# Patient Record
Sex: Female | Born: 1990 | Race: Asian | Hispanic: No | Marital: Married | Smoking: Never smoker
Health system: Southern US, Community
[De-identification: ages and names within clinical notes are randomized; demographics above are authoritative.]

## PROBLEM LIST (undated history)

## (undated) DIAGNOSIS — Z789 Other specified health status: Secondary | ICD-10-CM

## (undated) HISTORY — DX: Other specified health status: Z78.9

## (undated) HISTORY — PX: NO PAST SURGERIES: SHX2092

---

## 2016-11-07 ENCOUNTER — Encounter: Payer: Self-pay | Admitting: Obstetrics and Gynecology

## 2016-11-07 ENCOUNTER — Ambulatory Visit (INDEPENDENT_AMBULATORY_CARE_PROVIDER_SITE_OTHER): Payer: BLUE CROSS/BLUE SHIELD | Admitting: Obstetrics and Gynecology

## 2016-11-07 VITALS — BP 115/66 | HR 78 | Ht 63.0 in | Wt 149.5 lb

## 2016-11-07 DIAGNOSIS — N912 Amenorrhea, unspecified: Secondary | ICD-10-CM

## 2016-11-07 DIAGNOSIS — Z87898 Personal history of other specified conditions: Secondary | ICD-10-CM

## 2016-11-07 DIAGNOSIS — Z3201 Encounter for pregnancy test, result positive: Secondary | ICD-10-CM | POA: Insufficient documentation

## 2016-11-07 LAB — POCT URINE PREGNANCY: Preg Test, Ur: POSITIVE — AB

## 2016-11-07 NOTE — Progress Notes (Signed)
GYN ENCOUNTER NOTE  Subjective:       Hannah Larson is a 25 y.o. [id number] female is here for gynecologic evaluation of the following issues:  1. Pregnancy confirmation  LMP: 09/26/2016 (certain) EDD: 07/03/2017 EGA: 6.0 weeks  No history of vaginal bleeding, pelvic pain, nausea and vomiting. Last pregnancy was complicated by nausea and vomiting of pregnancy requiring Diclegis. Currently taking over-the-counter prenatal vitamins.  G1-SVD at 39 weeks, uncomplicated   Gynecologic History No LMP recorded. Patient is pregnant. Contraception: none  Obstetric History OB History  Gravida Para Term Preterm AB Living  2 1 1     1   SAB TAB Ectopic Multiple Live Births          1    # Outcome Date GA Lbr Len/2nd Weight Sex Delivery Anes PTL Lv  2 Current           1 Term 2016   8 lb 9.6 oz (3.901 kg) M Vag-Spont   LIV      Past Medical History:  Diagnosis Date  . Known health problems: none     Past Surgical History:  Procedure Laterality Date  . NO PAST SURGERIES      No current outpatient prescriptions on file prior to visit.   No current facility-administered medications on file prior to visit.     No Known Allergies  Social History   Social History  . Marital status: Married    Spouse name: N/A  . Number of children: N/A  . Years of education: N/A   Occupational History  . Not on file.   Social History Main Topics  . Smoking status: Never Smoker  . Smokeless tobacco: Never Used  . Alcohol use No  . Drug use: No  . Sexual activity: Yes    Birth control/ protection: None   Other Topics Concern  . Not on file   Social History Narrative  . No narrative on file    Family History  Problem Relation Age of Onset  . Diabetes Sister   . Breast cancer Neg Hx   . Migraines Neg Hx   . Colon cancer Neg Hx   . Heart disease Neg Hx     The following portions of the patient's history were reviewed and updated as appropriate: allergies, current medications,  past family history, past medical history, past social history, past surgical history and problem list.  Review of Systems Review of Systems -per History of present illness  Objective:   BP 115/66   Pulse 78   Ht 5\' 3"  (1.6 m)   Wt 149 lb 8 oz (67.8 kg)   BMI 26.48 kg/m  CONSTITUTIONAL: Well-developed, well-nourished female in no acute distress.  Physical exam-deferred    Assessment:   1. Amenorrhea - POCT urine pregnancy  2. Positive urine pregnancy test  3. History of nausea and vomiting, None current  Overall low risk pregnancy     Plan:   1. UPT-POSITIVE 2.  New OB counseling: The patient has been given an overview regarding routine prenatal care. Recommendations regarding diet, weight gain, and exercise in pregnancy were given. Prenatal testing, optional genetic testing, and ultrasound use in pregnancy were reviewed.  Benefits of Breast Feeding were discussed. The patient is encouraged to consider nursing her baby post partum. 3. Return in 3 weeks for nursing OB visit 4. Return in 5 weeks for new OB history and physical with Dr. Valentino Saxonherry  A total of 30 minutes were spent face-to-face with the patient  during the encounter with greater than 50% dealing with counseling and coordination of care.  Herold HarmsMartin A Josephus Harriger, MD  Note: This dictation was prepared with Dragon dictation along with smaller phrase technology. Any transcriptional errors that result from this process are unintentional.

## 2016-11-07 NOTE — Patient Instructions (Signed)
1. Urine pregnancy test is confirmed 2. Overview of prenatal care, diet and exercise in pregnancy reviewed 3. Return in 3 weeks for new OB nursing visit 4. Return in 5 weeks for new OB history and physical with Dr. Valentino Saxonherry 5. Continue with prenatal vitamins daily 6. Contact the office if nausea and vomiting of pregnancy develops

## 2016-11-09 ENCOUNTER — Telehealth: Payer: Self-pay | Admitting: Obstetrics and Gynecology

## 2016-11-09 MED ORDER — DOXYLAMINE-PYRIDOXINE 10-10 MG PO TBEC: 10.0000 mg | DELAYED_RELEASE_TABLET | Freq: Every day | ORAL | 1 refills | Status: DC

## 2016-11-09 NOTE — Telephone Encounter (Signed)
Pt aware diclegis erx.Coupon card left up front for p/u.

## 2016-11-09 NOTE — Telephone Encounter (Signed)
This pt saw Dr Jacques Earthlydef for confirmation. He told her if she needed a rx for nausea and she said Diclegis he would send it.

## 2016-12-04 NOTE — L&D Delivery Note (Signed)
Delivery Summary for Hannah Larson  Labor Events:   Preterm labor:   Rupture date:   Rupture time:   Rupture type: Artificial  Fluid Color: Clear  Induction:   Augmentation:   Complications:   Cervical ripening:          Delivery:   Episiotomy:   Lacerations:   Repair suture:   Repair # of packets:   Blood loss (ml): 100   Information for the patient's newborn:  Hannah Larson, Boy Hannah Larson [[id number]][[id number]]    Delivery 07/02/2017 1:05 PM by  Vaginal, Spontaneous Delivery Sex:  female Gestational Age: [id number] Delivery Clinician:   Living?:         APGARS  One minute Five minutes Ten minutes  Skin color:        Heart rate:        Grimace:        Muscle tone:        Breathing:        Totals: 9  9      Presentation/position:      Resuscitation:   Cord information:    Disposition of cord blood:     Blood gases sent?  Complications:   Placenta: Delivered:       appearance Newborn Measurements: Weight: 9 lb 14.4 oz (4490 g)  Height:    Head circumference:    Chest circumference:    Other providers:    Additional  information: Forceps:   Vacuum:   Breech:   Observed anomalies       Delivery Note At 1:05 PM a viable and healthy female was delivered via Vaginal, Spontaneous Delivery (Presentation: Vertex; LOA position).  APGAR: 9, 9; weight 9 lb 14.4 oz (4490 g).   Placenta status: spontaneously removed, intact.  Cord: 3-vessel with the following complications: None.  Cord pH: not obtained.  Mild shoulder dystocia present, relieved with suprapubic pressure and McRoberts maneuver.   Anesthesia: IV analgesia Episiotomy: None Lacerations: 1st degree;Perineal Suture Repair: 2.0 Vicryl Est. Blood Loss (mL):  100   Mom to postpartum.  Baby to Couplet care / Skin to Skin.  Hannah Larson 07/02/2017, 1:33 PM

## 2016-12-05 ENCOUNTER — Ambulatory Visit (INDEPENDENT_AMBULATORY_CARE_PROVIDER_SITE_OTHER): Payer: BLUE CROSS/BLUE SHIELD | Admitting: Obstetrics and Gynecology

## 2016-12-05 VITALS — BP 102/75 | HR 73 | Ht 63.0 in | Wt 145.9 lb

## 2016-12-05 DIAGNOSIS — Z1389 Encounter for screening for other disorder: Secondary | ICD-10-CM

## 2016-12-05 DIAGNOSIS — Z369 Encounter for antenatal screening, unspecified: Secondary | ICD-10-CM

## 2016-12-05 DIAGNOSIS — Z113 Encounter for screening for infections with a predominantly sexual mode of transmission: Secondary | ICD-10-CM

## 2016-12-05 DIAGNOSIS — Z3481 Encounter for supervision of other normal pregnancy, first trimester: Secondary | ICD-10-CM

## 2016-12-05 NOTE — Patient Instructions (Signed)
Pregnancy and Zika Virus Disease Introduction Zika virus disease, or Zika, is an illness that can spread to people from mosquitoes that carry the virus. It may also spread from person to person through infected body fluids. Zika first occurred in [place], but recently it has spread to new areas. The virus occurs in tropical climates. The location of Zika continues to change. Most people who become infected with Zika virus do not develop serious illness. However, Zika may cause birth defects in an unborn baby whose mother is infected with the virus. It may also increase the risk of miscarriage. What are the symptoms of Zika virus disease? In many cases, people who have been infected with Zika virus do not develop any symptoms. If symptoms appear, they usually start about a week after the person is infected. Symptoms are usually mild. They may include:  Fever.  Rash.  Red eyes.  Joint pain. How does Zika virus disease spread? The main way that Zika virus spreads is through the bite of a certain type of mosquito. Unlike most types of mosquitos, which bite only at night, the type of mosquito that carries Zika virus bites both at night and during the day. Zika virus can also spread through sexual contact, through a blood transfusion, and from a mother to her baby before or during birth. Once you have had Zika virus disease, it is unlikely that you will get it again. Can I pass Zika to my baby during pregnancy? Yes, Zika can pass from a mother to her baby before or during birth. What problems can Zika cause for my baby? A woman who is infected with Zika virus while pregnant is at risk of having her baby born with a condition in which the brain or head is smaller than expected (microcephaly). Babies who have microcephaly can have developmental delays, seizures, hearing problems, and vision problems. Having Zika virus disease during pregnancy can also increase the risk of miscarriage. How can Zika  virus disease be prevented? There is no vaccine to prevent Zika. The best way to prevent the disease is to avoid infected mosquitoes and avoid exposure to body fluids that can spread the virus. Avoid any possible exposure to Zika by taking the following precautions. For women and their sex partners:  Avoid traveling to high-risk areas. The locations where Zika is being reported change often. To identify high-risk areas, check the CDC travel website: www.cdc.gov/zika/geo/index.html  If you or your sex partner must travel to a high-risk area, talk with a health care provider before and after traveling.  Take all precautions to avoid mosquito bites if you live in, or travel to, any of the high-risk areas. Insect repellents are safe to use during pregnancy.  Ask your health care provider when it is safe to have sexual contact. For women:  If you are pregnant or trying to become pregnant, avoid sexual contact with persons who may have been exposed to Zika virus, persons who have possible symptoms of Zika, or persons whose history you are unsure about. If you choose to have sexual contact with someone who may have been exposed to Zika virus, use condoms correctly during the entire duration of sexual activity, every time. Do not share sexual devices, as you may be exposed to body fluids.  Ask your health care provider about when it is safe to attempt pregnancy after a possible exposure to Zika virus. What steps should I take to avoid mosquito bites? Take these steps to avoid mosquito bites when you   are in a high-risk area:  Wear loose clothing that covers your arms and legs.  Limit your outdoor activities.  Do not open windows unless they have window screens.  Sleep under mosquito nets.  Use insect repellent. The best insect repellents have:  DEET, picaridin, oil of lemon eucalyptus (OLE), or IR3535 in them.  Higher amounts of an active ingredient in them.  Remember that insect repellents  are safe to use during pregnancy.  Do not use OLE on children who are younger than 3 years of age. Do not use insect repellent on babies who are younger than 2 months of age.  Cover your child's stroller with mosquito netting. Make sure the netting fits snugly and that any loose netting does not cover your child's mouth or nose. Do not use a blanket as a mosquito-protection cover.  Do not apply insect repellent underneath clothing.  If you are using sunscreen, apply the sunscreen before applying the insect repellent.  Treat clothing with permethrin. Do not apply permethrin directly to your skin. Follow label directions for safe use.  Get rid of standing water, where mosquitoes may reproduce. Standing water is often found in items such as buckets, bowls, animal food dishes, and flowerpots. When you return from traveling to any high-risk area, continue taking actions to protect yourself against mosquito bites for 3 weeks, even if you show no signs of illness. This will prevent spreading Zika virus to uninfected mosquitoes. What should I know about the sexual transmission of Zika? People can spread Zika to their sexual partners during vaginal, anal, or oral sex, or by sharing sexual devices. Many people with Zika do not develop symptoms, so a person could spread the disease without knowing that they are infected. The greatest risk is to women who are pregnant or who may become pregnant. Zika virus can live longer in semen than it can live in blood. Couples can prevent sexual transmission of the virus by:  Using condoms correctly during the entire duration of sexual activity, every time. This includes vaginal, anal, and oral sex.  Not sharing sexual devices. Sharing increases your risk of being exposed to body fluid from another person.  Avoiding all sexual activity until your health care provider says it is safe. Should I be tested for Zika virus? A sample of your blood can be tested for Zika  virus. A pregnant woman should be tested if she may have been exposed to the virus or if she has symptoms of Zika. She may also have additional tests done during her pregnancy, such ultrasound testing. Talk with your health care provider about which tests are recommended. This information is not intended to replace advice given to you by your health care provider. Make sure you discuss any questions you have with your health care provider. Document Released: 08/11/2015 Document Revised: 04/27/2016 Document Reviewed: 08/04/2015  2017 Elsevier Minor Illnesses and Medications in Pregnancy  Cold/Flu:  Sudafed for congestion- Robitussin (plain) for cough- Tylenol for discomfort.  Please follow the directions on the label.  Try not to take any more than needed.  OTC Saline nasal spray and air humidifier or cool-mist  Vaporizer to sooth nasal irritation and to loosen congestion.  It is also important to increase intake of non carbonated fluids, especially if you have a fever.  Constipation:  Colace-2 capsules at bedtime; Metamucil- follow directions on label; Senokot- 1 tablet at bedtime.  Any one of these medications can be used.  It is also very important to increase   fluids and fruits along with regular exercise.  If problem persists please call the office.  Diarrhea:  Kaopectate as directed on the label.  Eat a bland diet and increase fluids.  Avoid highly seasoned foods.  Headache:  Tylenol 1 or 2 tablets every 3-4 hours as needed  Indigestion:  Maalox, Mylanta, Tums or Rolaids- as directed on label.  Also try to eat small meals and avoid fatty, greasy or spicy foods.  Nausea with or without Vomiting:  Nausea in pregnancy is caused by increased levels of hormones in the body which influence the digestive system and cause irritation when stomach acids accumulate.  Symptoms usually subside after 1st trimester of pregnancy.  Try the following: 1. Keep saltines, graham crackers or dry toast by your bed to  eat upon awakening. 2. Don't let your stomach get empty.  Try to eat 5-6 small meals per day instead of 3 large ones. 3. Avoid greasy fatty or highly seasoned foods.  4. Take OTC Unisom 1 tablet at bed time along with OTC Vitamin B6 25-50 mg 3 times per day.    If nausea continues with vomiting and you are unable to keep down food and fluids you may need a prescription medication.  Please notify your provider.   Sore throat:  Chloraseptic spray, throat lozenges and or plain Tylenol.  Vaginal Yeast Infection:  OTC Monistat for 7 days as directed on label.  If symptoms do not resolve within a week notify provider.  If any of the above problems do not subside with recommended treatment please call the office for further assistance.   Do not take Aspirin, Advil, Motrin or Ibuprofen.  * * OTC= Over the counter Hyperemesis Gravidarum Hyperemesis gravidarum is a severe form of nausea and vomiting that happens during pregnancy. Hyperemesis is worse than morning sickness. It may cause you to have nausea or vomiting all day for many days. It may keep you from eating and drinking enough food and liquids. Hyperemesis usually occurs during the first half (the first 20 weeks) of pregnancy. It often goes away once a woman is in her second half of pregnancy. However, sometimes hyperemesis continues through an entire pregnancy. What are the causes? The cause of this condition is not known. It may be related to changes in chemicals (hormones) in the body during pregnancy, such as the high level of pregnancy hormone (human chorionic gonadotropin) or the increase in the female sex hormone (estrogen). What are the signs or symptoms? Symptoms of this condition include:  Severe nausea and vomiting.  Nausea that does not go away.  Vomiting that does not allow you to keep any food down.  Weight loss.  Body fluid loss (dehydration).  Having no desire to eat, or not liking food that you have previously  enjoyed. How is this diagnosed? This condition may be diagnosed based on:  A physical exam.  Your medical history.  Your symptoms.  Blood tests.  Urine tests. How is this treated? This condition may be managed with medicine. If medicines to do not help relieve nausea and vomiting, you may need to receive fluids through an IV tube at the hospital. Follow these instructions at home:  Take over-the-counter and prescription medicines only as told by your health care provider.  Avoid iron pills and multivitamins that contain iron for the first 3-4 months of pregnancy. If you take prescription iron pills, do not stop taking them unless your health care provider approves.  Take the following actions to help   prevent nausea and vomiting:  In the morning, before getting out of bed, try eating a couple of dry crackers or a piece of toast.  Avoid foods and smells that upset your stomach. Fatty and spicy foods may make nausea worse.  Eat 5-6 small meals a day.  Do not drink fluids while eating meals. Drink between meals.  Eat or suck on things that have ginger in them. Ginger can help relieve nausea.  Avoid food preparation. The smell of food can spoil your appetite or trigger nausea.  Follow instructions from your health care provider about eating or drinking restrictions.  For snacks, eat high-protein foods, such as cheese.  Keep all follow-up and pre-birth (prenatal) visits as told by your health care provider. This is important. Contact a health care provider if:  You have pain in your abdomen.  You have a severe headache.  You have vision problems.  You are losing weight. Get help right away if:  You cannot drink fluids without vomiting.  You vomit blood.  You have constant nausea and vomiting.  You are very weak.  You are very thirsty.  You feel dizzy.  You faint.  You have a fever or other symptoms that last for more than 2-3 days.  You have a fever and  your symptoms suddenly get worse. Summary  Hyperemesis gravidarum is a severe form of nausea and vomiting that happens during pregnancy.  Making some changes to your eating habits may help relieve nausea and vomiting.  This condition may be managed with medicine.  If medicines to do not help relieve nausea and vomiting, you may need to receive fluids through an IV tube at the hospital. This information is not intended to replace advice given to you by your health care provider. Make sure you discuss any questions you have with your health care provider. Document Released: 11/20/2005 Document Revised: 07/19/2016 Document Reviewed: 07/19/2016 Elsevier Interactive Patient Education  2017 [employer] First Trimester of Pregnancy The first trimester of pregnancy is from week 1 until the end of week 12 (months 1 through 3). During this time, your baby will begin to develop inside you. At 6-8 weeks, the eyes and face are formed, and the heartbeat can be seen on ultrasound. At the end of 12 weeks, all the baby's organs are formed. Prenatal care is all the medical care you receive before the birth of your baby. Make sure you get good prenatal care and follow all of your doctor's instructions. Follow these instructions at home: Medicines  Take medicine only as told by your doctor. Some medicines are safe and some are not during pregnancy.  Take your prenatal vitamins as told by your doctor.  Take medicine that helps you poop (stool softener) as needed if your doctor says it is okay. Diet  Eat regular, healthy meals.  Your doctor will tell you the amount of weight gain that is right for you.  Avoid raw meat and uncooked cheese.  If you feel sick to your stomach (nauseous) or throw up (vomit):  Eat 4 or 5 small meals a day instead of 3 large meals.  Try eating a few soda crackers.  Drink liquids between meals instead of during meals.  If you have a hard time pooping  (constipation):  Eat high-fiber foods like fresh vegetables, fruit, and whole grains.  Drink enough fluids to keep your pee (urine) clear or pale yellow. Activity and Exercise  Exercise only as told by your doctor. Stop exercising if   you have cramps or pain in your lower belly (abdomen) or low back.  Try to avoid standing for long periods of time. Move your legs often if you must stand in one place for a long time.  Avoid heavy lifting.  Wear low-heeled shoes. Sit and stand up straight.  You can have sex unless your doctor tells you not to. Relief of Pain or Discomfort  Wear a good support bra if your breasts are sore.  Take warm water baths (sitz baths) to soothe pain or discomfort caused by hemorrhoids. Use hemorrhoid cream if your doctor says it is okay.  Rest with your legs raised if you have leg cramps or low back pain.  Wear support hose if you have puffy, bulging veins (varicose veins) in your legs. Raise (elevate) your feet for 15 minutes, 3-4 times a day. Limit salt in your diet. Prenatal Care  Schedule your prenatal visits by the twelfth week of pregnancy.  Write down your questions. Take them to your prenatal visits.  Keep all your prenatal visits as told by your doctor. Safety  Wear your seat belt at all times when driving.  Make a list of emergency phone numbers. The list should include numbers for family, friends, the hospital, and police and fire departments. General Tips  Ask your doctor for a referral to a local prenatal class. Begin classes no later than at the start of month 6 of your pregnancy.  Ask for help if you need counseling or help with nutrition. Your doctor can give you advice or tell you where to go for help.  Do not use hot tubs, steam rooms, or saunas.  Do not douche or use tampons or scented sanitary pads.  Do not cross your legs for long periods of time.  Avoid litter boxes and soil used by cats.  Avoid all smoking, herbs, and  alcohol. Avoid drugs not approved by your doctor.  Do not use any tobacco products, including cigarettes, chewing tobacco, and electronic cigarettes. If you need help quitting, ask your doctor. You may get counseling or other support to help you quit.  Visit your dentist. At home, brush your teeth with a soft toothbrush. Be gentle when you floss. Get help if:  You are dizzy.  You have mild cramps or pressure in your lower belly.  You have a nagging pain in your belly area.  You continue to feel sick to your stomach, throw up, or have watery poop (diarrhea).  You have a bad smelling fluid coming from your vagina.  You have pain with peeing (urination).  You have increased puffiness (swelling) in your face, hands, legs, or ankles. Get help right away if:  You have a fever.  You are leaking fluid from your vagina.  You have spotting or bleeding from your vagina.  You have very bad belly cramping or pain.  You gain or lose weight rapidly.  You throw up blood. It may look like coffee grounds.  You are around people who have German measles, fifth disease, or chickenpox.  You have a very bad headache.  You have shortness of breath.  You have any kind of trauma, such as from a fall or a car accident. This information is not intended to replace advice given to you by your health care provider. Make sure you discuss any questions you have with your health care provider. Document Released: 05/08/2008 Document Revised: 04/27/2016 Document Reviewed: 09/30/2013 Elsevier Interactive Patient Education  2017 [employer] Inc. Commonly Asked Questions   During Pregnancy  Cats: A parasite can be excreted in cat feces.  To avoid exposure you need to have another person empty the little box.  If you must empty the litter box you will need to wear gloves.  Wash your hands after handling your cat.  This parasite can also be found in raw or undercooked meat so this should also be avoided.  Colds,  Sore Throats, Flu: Please check your medication sheet to see what you can take for symptoms.  If your symptoms are unrelieved by these medications please call the office.  Dental Work: Most any dental work your dentist recommends is permitted.  X-rays should only be taken during the first trimester if absolutely necessary.  Your abdomen should be shielded with a lead apron during all x-rays.  Please notify your provider prior to receiving any x-rays.  Novocaine is fine; gas is not recommended.  If your dentist requires a note from us prior to dental work please call the office and we will provide one for you.  Exercise: Exercise is an important part of staying healthy during your pregnancy.  You may continue most exercises you were accustomed to prior to pregnancy.  Later in your pregnancy you will most likely notice you have difficulty with activities requiring balance like riding a bicycle.  It is important that you listen to your body and avoid activities that put you at a higher risk of falling.  Adequate rest and staying well hydrated are a must!  If you have questions about the safety of specific activities ask your provider.    Exposure to Children with illness: Try to avoid obvious exposure; report any symptoms to us when noted,  If you have chicken pos, red measles or mumps, you should be immune to these diseases.   Please do not take any vaccines while pregnant unless you have checked with your OB provider.  Fetal Movement: After 28 weeks we recommend you do "kick counts" twice daily.  Lie or sit down in a calm quiet environment and count your baby movements "kicks".  You should feel your baby at least 10 times per hour.  If you have not felt 10 kicks within the first hour get up, walk around and have something sweet to eat or drink then repeat for an additional hour.  If count remains less than 10 per hour notify your provider.  Fumigating: Follow your pest control agent's advice as to how long  to stay out of your home.  Ventilate the area well before re-entering.  Hemorrhoids:   Most over-the-counter preparations can be used during pregnancy.  Check your medication to see what is safe to use.  It is important to use a stool softener or fiber in your diet and to drink lots of liquids.  If hemorrhoids seem to be getting worse please call the office.   Hot Tubs:  Hot tubs Jacuzzis and saunas are not recommended while pregnant.  These increase your internal body temperature and should be avoided.  Intercourse:  Sexual intercourse is safe during pregnancy as long as you are comfortable, unless otherwise advised by your provider.  Spotting may occur after intercourse; report any bright red bleeding that is heavier than spotting.  Labor:  If you know that you are in labor, please go to the hospital.  If you are unsure, please call the office and let us help you decide what to do.  Lifting, straining, etc:  If your job requires heavy lifting or   straining please check with your provider for any limitations.  Generally, you should not lift items heavier than that you can lift simply with your hands and arms (no back muscles)  Painting:  Paint fumes do not harm your pregnancy, but may make you ill and should be avoided if possible.  Latex or water based paints have less odor than oils.  Use adequate ventilation while painting.  Permanents & Hair Color:  Chemicals in hair dyes are not recommended as they cause increase hair dryness which can increase hair loss during pregnancy.  " Highlighting" and permanents are allowed.  Dye may be absorbed differently and permanents may not hold as well during pregnancy.  Sunbathing:  Use a sunscreen, as skin burns easily during pregnancy.  Drink plenty of fluids; avoid over heating.  Tanning Beds:  Because their possible side effects are still unknown, tanning beds are not recommended.  Ultrasound Scans:  Routine ultrasounds are performed at approximately 20  weeks.  You will be able to see your baby's general anatomy an if you would like to know the gender this can usually be determined as well.  If it is questionable when you conceived you may also receive an ultrasound early in your pregnancy for dating purposes.  Otherwise ultrasound exams are not routinely performed unless there is a medical necessity.  Although you can request a scan we ask that you pay for it when conducted because insurance does not cover " patient request" scans.  Work: If your pregnancy proceeds without complications you may work until your due date, unless your physician or employer advises otherwise.  Round Ligament Pain/Pelvic Discomfort:  Sharp, shooting pains not associated with bleeding are fairly common, usually occurring in the second trimester of pregnancy.  They tend to be worse when standing up or when you remain standing for long periods of time.  These are the result of pressure of certain pelvic ligaments called "round ligaments".  Rest, Tylenol and heat seem to be the most effective relief.  As the womb and fetus grow, they rise out of the pelvis and the discomfort improves.  Please notify the office if your pain seems different than that described.  It may represent a more serious condition.   

## 2016-12-05 NOTE — Progress Notes (Signed)
Hannah Larson presents for NOB nurse interview visit. Pregnancy confirmation done 11/07/2016. LMP: 09/26/2016 (EXACTLY).  No history of irregular menses or vaginal bleeding during this pregnancy. G-2  P-1001.  Pregnancy education material explained and given. No cats in the home. NOB labs ordered. HIV labs and Drug screen were explained optional and she did not decline. Drug screen ordered. PNV encouraged. Genetic screening, pt unsure. May discuss with provider. Pt states she only takes one Diclegsis daily and she gets sleepy. May try OTC Vitamin B6 25mg  po 3xd instead.  Pt. To follow up with provider in 1 weeks for NOB physical.  All questions answered.

## 2016-12-07 LAB — ABO

## 2016-12-07 LAB — VARICELLA ZOSTER ANTIBODY, IGG: Varicella zoster IgG: 542 index (ref >165)

## 2016-12-07 LAB — HIV ANTIBODY (ROUTINE TESTING W REFLEX): HIV Screen 4th Generation wRfx: NONREACTIVE

## 2016-12-07 LAB — CBC WITH DIFFERENTIAL/PLATELET
Basophils Absolute: 0 10*3/uL (ref 0.0–0.2)
Basos: 0 %
EOS (ABSOLUTE): 0.1 10*3/uL (ref 0.0–0.4)
Eos: 1 %
Hematocrit: 38.3 % (ref 34.0–46.6)
Hemoglobin: 12.9 g/dL (ref 11.1–15.9)
Immature Grans (Abs): 0 10*3/uL (ref 0.0–0.1)
Immature Granulocytes: 0 %
Lymphocytes Absolute: 1.2 10*3/uL (ref 0.7–3.1)
Lymphs: 15 %
MCH: 29.4 pg (ref 26.6–33.0)
MCHC: 33.7 g/dL (ref 31.5–35.7)
MCV: 87 fL (ref 79–97)
Monocytes Absolute: 0.4 10*3/uL (ref 0.1–0.9)
Monocytes: 5 %
Neutrophils Absolute: 6.2 10*3/uL (ref 1.4–7.0)
Neutrophils: 79 %
Platelets: 264 10*3/uL (ref 150–379)
RBC: 4.39 x10E6/uL (ref 3.77–5.28)
RDW: 13.3 % (ref 12.3–15.4)
WBC: 8 10*3/uL (ref 3.4–10.8)

## 2016-12-07 LAB — HEPATITIS B SURFACE ANTIGEN: Hepatitis B Surface Ag: NEGATIVE

## 2016-12-07 LAB — RUBELLA SCREEN: Rubella Antibodies, IGG: <0.9 index — ABNORMAL LOW (ref >0.99)

## 2016-12-07 LAB — RH TYPE: Rh Factor: POSITIVE

## 2016-12-07 LAB — RPR: RPR Ser Ql: NONREACTIVE

## 2016-12-07 LAB — ANTIBODY SCREEN: Antibody Screen: NEGATIVE

## 2016-12-08 ENCOUNTER — Telehealth: Payer: Self-pay

## 2016-12-08 DIAGNOSIS — O2341 Unspecified infection of urinary tract in pregnancy, first trimester: Secondary | ICD-10-CM

## 2016-12-08 LAB — MONITOR DRUG PROFILE 14(MW)
Amphetamine Scrn, Ur: NEGATIVE ng/mL
BARBITURATE SCREEN URINE: NEGATIVE ng/mL
BENZODIAZEPINE SCREEN, URINE: NEGATIVE ng/mL
Buprenorphine, Urine: NEGATIVE ng/mL
CANNABINOIDS UR QL SCN: NEGATIVE ng/mL
Cocaine (Metab) Scrn, Ur: NEGATIVE ng/mL
Creatinine(Crt), U: 62.4 mg/dL (ref 20.0–300.0)
Fentanyl, Urine: NEGATIVE pg/mL
Meperidine Screen, Urine: NEGATIVE ng/mL
Methadone Screen, Urine: NEGATIVE ng/mL
OXYCODONE+OXYMORPHONE UR QL SCN: NEGATIVE ng/mL
Opiate Scrn, Ur: NEGATIVE ng/mL
Ph of Urine: 6.8 (ref 4.5–8.9)
Phencyclidine Qn, Ur: NEGATIVE ng/mL
Propoxyphene Scrn, Ur: NEGATIVE ng/mL
SPECIFIC GRAVITY: 1.018
Tramadol Screen, Urine: NEGATIVE ng/mL

## 2016-12-08 LAB — MICROSCOPIC EXAMINATION
Casts: NONE SEEN /lpf
Epithelial Cells (non renal): >10 /hpf — AB (ref 0–10)

## 2016-12-08 LAB — URINALYSIS, ROUTINE W REFLEX MICROSCOPIC
Bilirubin, UA: NEGATIVE
Glucose, UA: NEGATIVE
Ketones, UA: NEGATIVE
Nitrite, UA: POSITIVE — AB
Protein, UA: NEGATIVE
RBC, UA: NEGATIVE
Specific Gravity, UA: 1.017 (ref 1.005–1.030)
Urobilinogen, Ur: 0.2 mg/dL (ref 0.2–1.0)
pH, UA: 7 (ref 5.0–7.5)

## 2016-12-08 LAB — GC/CHLAMYDIA PROBE AMP
Chlamydia trachomatis, NAA: NEGATIVE
Neisseria gonorrhoeae by PCR: NEGATIVE

## 2016-12-08 LAB — NICOTINE SCREEN, URINE: Cotinine Ql Scrn, Ur: NEGATIVE ng/mL

## 2016-12-08 MED ORDER — NITROFURANTOIN MONOHYD MACRO 100 MG PO CAPS: 100.0000 mg | ORAL_CAPSULE | Freq: Two times a day (BID) | ORAL | 0 refills | Status: DC

## 2016-12-08 NOTE — Telephone Encounter (Signed)
Called pt informed her of UTI. U/A was performed by [facility] not in house. Unsure as to why urine cx was not collected. RX for Macrobid was sent in.

## 2016-12-08 NOTE — Telephone Encounter (Signed)
-----   Message from Hildred LaserAnika Cherry, MD sent at 12/08/2016 10:04 AM EST ----- Patient with nitrite positive urine at her NOB intake.  Doesn't look like Debbie prescribed her anything.  Please inform patient of UTI and call in Macrobid.  Culture is still in process.

## 2016-12-11 LAB — URINE CULTURE, OB REFLEX

## 2016-12-11 LAB — CULTURE, OB URINE

## 2016-12-13 ENCOUNTER — Ambulatory Visit (INDEPENDENT_AMBULATORY_CARE_PROVIDER_SITE_OTHER): Payer: BLUE CROSS/BLUE SHIELD | Admitting: Obstetrics and Gynecology

## 2016-12-13 VITALS — BP 101/63 | HR 83 | Wt 144.9 lb

## 2016-12-13 DIAGNOSIS — O99891 Other specified diseases and conditions complicating pregnancy: Secondary | ICD-10-CM

## 2016-12-13 DIAGNOSIS — O09899 Supervision of other high risk pregnancies, unspecified trimester: Secondary | ICD-10-CM

## 2016-12-13 DIAGNOSIS — O2341 Unspecified infection of urinary tract in pregnancy, first trimester: Secondary | ICD-10-CM

## 2016-12-13 DIAGNOSIS — O9989 Other specified diseases and conditions complicating pregnancy, childbirth and the puerperium: Secondary | ICD-10-CM

## 2016-12-13 DIAGNOSIS — Z3481 Encounter for supervision of other normal pregnancy, first trimester: Secondary | ICD-10-CM

## 2016-12-13 DIAGNOSIS — E663 Overweight: Secondary | ICD-10-CM

## 2016-12-13 DIAGNOSIS — Z283 Underimmunization status: Secondary | ICD-10-CM

## 2016-12-13 DIAGNOSIS — O219 Vomiting of pregnancy, unspecified: Secondary | ICD-10-CM

## 2016-12-13 LAB — POCT URINALYSIS DIPSTICK
Bilirubin, UA: NEGATIVE
Glucose, UA: NEGATIVE
Ketones, UA: NEGATIVE
Nitrite, UA: NEGATIVE
Protein, UA: NEGATIVE
Spec Grav, UA: 1.025
Urobilinogen, UA: NEGATIVE
pH, UA: 7

## 2016-12-13 NOTE — Progress Notes (Signed)
OBSTETRIC INITIAL PRENATAL VISIT  Subjective:    Hannah Larson is being seen today for her first obstetrical visit.  This is a planned pregnancy. She is a G39P1001 female at 55w1dgestation, Estimated Date of Delivery: 07/03/17 with Patient's last menstrual period was 09/26/2016 (exact date).  Her obstetrical history is significant for UTI in early pregnancy. Relationship with FOB: spouse, living together. Patient unsure if she intends to breast feed. Pregnancy history fully reviewed.    Obstetric History   G2   P1   T1   P0   A0   L1    SAB0   TAB0   Ectopic0   Multiple0   Live Births1     # Outcome Date GA Lbr Len/2nd Weight Sex Delivery Anes PTL Lv  2 Current           1 Term 2016   8 lb 9.6 oz (3.901 kg) M Vag-Spont   LIV      Gynecologic History:  Last pap smear was 2016.  Results were normal.  Denies h/o abnormal pap smears in the past.  Denies history of STIs.  Contraception: combined OCPs, discontinued ~ 6 months prior to pregnancy.    Past Medical History:  Diagnosis Date  . Known health problems: none     Family History  Problem Relation Age of Onset  . Diabetes Sister   . Breast cancer Neg Hx   . Migraines Neg Hx   . Colon cancer Neg Hx   . Heart disease Neg Hx     Past Surgical History:  Procedure Laterality Date  . NO PAST SURGERIES      Social History   Social History  . Marital status: Married    Spouse name: N/A  . Number of children: N/A  . Years of education: N/A   Occupational History  . Not on file.   Social History Main Topics  . Smoking status: Never Smoker  . Smokeless tobacco: Never Used  . Alcohol use No  . Drug use: No  . Sexual activity: Yes    Birth control/ protection: None   Other Topics Concern  . Not on file   Social History Narrative  . No narrative on file    Current Outpatient Prescriptions on File Prior to Visit  Medication Sig Dispense Refill  . acetaminophen (TYLENOL) 325 MG tablet Take 650 mg by mouth every  6 (six) hours as needed.    . Doxylamine-Pyridoxine 10-10 MG TBEC Take 10 mg by mouth at bedtime. Day 1-2- take 2 at bedtime Day 3- if sx persist 2 at bedtime , 1 in the a.m. Day 4- if sx persist 2 at bedtime, 1 in a.m. 2 at p.m NO MORE THAN 4 A DAY 60 tablet 1  . nitrofurantoin, macrocrystal-monohydrate, (MACROBID) 100 MG capsule Take 1 capsule (100 mg total) by mouth 2 (two) times daily. 14 capsule 0  . Prenatal Vit-Fe Fumarate-FA (PRENATAL VITAMINS) 28-0.8 MG TABS Take 1 tablet by mouth daily.     No current facility-administered medications on file prior to visit.      No Known Allergies   Review of Systems General:Not Present- Fever, Weight Loss and Weight Gain. Skin:Not Present- Rash. HEENT:Not Present- Blurred Vision, Headache and Bleeding Gums. Respiratory:Not Present- Difficulty Breathing. Breast:Not Present- Breast Mass. Cardiovascular:Not Present- Chest Pain, Elevated Blood Pressure, Fainting / Blacking Out and Shortness of Breath. Gastrointestinal:Present- Nausea and Vomiting (notes Diclegis is not helping, has also tried [place]). Not Present- Abdominal Pain, Constipation.  Female Genitourinary:Not Present- Frequency, Painful Urination, Pelvic Pain, Vaginal Bleeding, Vaginal Discharge, Contractions, regular, Fetal Movements Decreased, Urinary Complaints and Vaginal Fluid. Musculoskeletal:Not Present- Back Pain and Leg Cramps. Neurological:Not Present- Dizziness. Psychiatric:Not Present- Depression.     Objective:   Blood pressure 101/63, pulse 83, weight 144 lb 14.4 oz (65.7 kg), last menstrual period 09/26/2016.  Body mass index is 25.67 kg/m.  General Appearance:    Alert, cooperative, no distress, appears stated age, overweight   Head:    Normocephalic, without obvious abnormality, atraumatic  Eyes:    PERRL, conjunctiva/corneas clear, EOM's intact, both eyes  Ears:    Normal external ear canals, both ears  Nose:   Nares normal, septum midline, mucosa  normal, no drainage or sinus tenderness  Throat:   Lips, mucosa, and tongue normal; teeth and gums normal  Neck:   Supple, symmetrical, trachea midline, no adenopathy; thyroid: no enlargement/tenderness/nodules; no carotid bruit or JVD  Back:     Symmetric, no curvature, ROM normal, no CVA tenderness  Lungs:     Clear to auscultation bilaterally, respirations unlabored  Chest Wall:    No tenderness or deformity   Heart:    Regular rate and rhythm, S1 and S2 normal, no murmur, rub or gallop  Breast Exam:    No tenderness, masses, or nipple abnormality  Abdomen:     Soft, non-tender, bowel sounds active all four quadrants, no masses, no organomegaly.  FH 11.  FHT 164  bpm.  Genitalia:    Pelvic:external genitalia normal, vagina without lesions, discharge, or tenderness, rectovaginal septum  normal. Cervix normal in appearance, no cervical motion tenderness, no adnexal masses or tenderness.  Pregnancy positive findings: uterine enlargement: 11 wk size, nontender.   Rectal:    Normal external sphincter.  No hemorrhoids appreciated. Internal exam not done.   Extremities:   Extremities normal, atraumatic, no cyanosis or edema  Pulses:   2+ and symmetric all extremities  Skin:   Skin color, texture, turgor normal, no rashes or lesions  Lymph nodes:   Cervical, supraclavicular, and axillary nodes normal  Neurologic:   CNII-XII intact, normal strength, sensation and reflexes throughout     Assessment:   Pregnancy at 11 and 1/7 weeks  UTI in pregnancy   Rubella non-immune Nausea/vomiting in pregnancy Overweight  Plan:   1. Pregnancy at 11 and 1/7 weeks - Initial labs reviewed. - Pap smear up to date - Prenatal vitamins encouraged. - Problem list reviewed and updated. - New OB counseling:  The patient has been given an overview regarding routine prenatal care.  - Prenatal testing, optional genetic testing, and ultrasound use in pregnancy were reviewed.  AFP3 and cell-free DNA testing  discussed: undecided. To contact office once she decides if testing is desired to have order placed.  - Benefits of Breast Feeding were discussed. The patient is encouraged to consider nursing her baby post partum.  2. UTI in pregnancy, - Patient with + nitrites on UA, unable to send culture due to deficient amount of specimen. Treated with Macrobid, still has 2 days left of treatment. Will have repeat UCx forTOC to be performed at next visit.   3. Rubella non-immune - Will need MMR postpartum   4. Nausea/vomiting - Patient notes Diclegis is not working. Had severe nausea/vomiting in last pregnancy in first trimester.  Discussed other options, such as prescription for Zofran/Phenergan.  Patient declines for now, Notes that these episodes are not as bad as in previous pregnancy.  Will try to wait  it out until she gets to 2nd trimester.   5. Overweight  - Recommendations regarding diet, weight gain, and exercise in pregnancy were given. To gain no more than 35-40 lbs this pregnancy.   Follow up in 4 weeks.  50% of 30 min visit spent on counseling and coordination of care.    Rubie Maid, MD [facility]

## 2016-12-17 DIAGNOSIS — O2341 Unspecified infection of urinary tract in pregnancy, first trimester: Secondary | ICD-10-CM | POA: Insufficient documentation

## 2016-12-17 DIAGNOSIS — Z283 Underimmunization status: Secondary | ICD-10-CM | POA: Insufficient documentation

## 2016-12-17 DIAGNOSIS — O9989 Other specified diseases and conditions complicating pregnancy, childbirth and the puerperium: Secondary | ICD-10-CM

## 2016-12-17 DIAGNOSIS — O99891 Other specified diseases and conditions complicating pregnancy: Secondary | ICD-10-CM | POA: Insufficient documentation

## 2016-12-17 DIAGNOSIS — E663 Overweight: Secondary | ICD-10-CM | POA: Insufficient documentation

## 2016-12-17 DIAGNOSIS — O219 Vomiting of pregnancy, unspecified: Secondary | ICD-10-CM | POA: Insufficient documentation

## 2016-12-17 DIAGNOSIS — O09899 Supervision of other high risk pregnancies, unspecified trimester: Secondary | ICD-10-CM | POA: Insufficient documentation

## 2017-01-11 ENCOUNTER — Ambulatory Visit (INDEPENDENT_AMBULATORY_CARE_PROVIDER_SITE_OTHER): Payer: BLUE CROSS/BLUE SHIELD | Admitting: Obstetrics and Gynecology

## 2017-01-11 VITALS — BP 103/56 | HR 73 | Wt 142.1 lb

## 2017-01-11 DIAGNOSIS — Z3482 Encounter for supervision of other normal pregnancy, second trimester: Secondary | ICD-10-CM

## 2017-01-11 DIAGNOSIS — Z87440 Personal history of urinary (tract) infections: Secondary | ICD-10-CM

## 2017-01-11 LAB — POCT URINALYSIS DIPSTICK
Bilirubin, UA: NEGATIVE
Glucose, UA: NEGATIVE
Ketones, UA: NEGATIVE
Nitrite, UA: NEGATIVE
Protein, UA: NEGATIVE
Spec Grav, UA: 1.015
Urobilinogen, UA: NEGATIVE
pH, UA: 6.5

## 2017-01-11 NOTE — Progress Notes (Signed)
ROB: Doing well.  Complains of residual UTI symptoms.  Recently treated for UTI.  TOC done today.  Encouraged increased fluids, Azo if needed.  Declines flu vaccine today, will receive next visit. RTC in 4 weeks, for anatomy scan at that time.

## 2017-01-15 LAB — URINE CULTURE

## 2017-02-09 ENCOUNTER — Encounter: Payer: Self-pay | Admitting: Obstetrics and Gynecology

## 2017-02-09 ENCOUNTER — Ambulatory Visit (INDEPENDENT_AMBULATORY_CARE_PROVIDER_SITE_OTHER): Payer: BLUE CROSS/BLUE SHIELD

## 2017-02-09 ENCOUNTER — Ambulatory Visit (INDEPENDENT_AMBULATORY_CARE_PROVIDER_SITE_OTHER): Payer: BLUE CROSS/BLUE SHIELD | Admitting: Obstetrics and Gynecology

## 2017-02-09 VITALS — BP 99/56 | HR 77 | Wt 147.6 lb

## 2017-02-09 DIAGNOSIS — Z3492 Encounter for supervision of normal pregnancy, unspecified, second trimester: Secondary | ICD-10-CM

## 2017-02-09 DIAGNOSIS — Z3482 Encounter for supervision of other normal pregnancy, second trimester: Secondary | ICD-10-CM

## 2017-02-09 LAB — POCT URINALYSIS DIPSTICK
Bilirubin, UA: NEGATIVE
Blood, UA: NEGATIVE
Glucose, UA: NEGATIVE
Ketones, UA: NEGATIVE
Leukocytes, UA: NEGATIVE
Nitrite, UA: NEGATIVE
Protein, UA: NEGATIVE
Spec Grav, UA: >=1.03
Urobilinogen, UA: NEGATIVE
pH, UA: 5

## 2017-02-09 NOTE — Progress Notes (Signed)
Patient no longer taking dye cleavages-feels better. No urinary complaints. Reports occasional fetal movement. Normal anatomy scan today.  Has declined flu vac.

## 2017-03-09 ENCOUNTER — Ambulatory Visit (INDEPENDENT_AMBULATORY_CARE_PROVIDER_SITE_OTHER): Payer: BLUE CROSS/BLUE SHIELD | Admitting: Obstetrics and Gynecology

## 2017-03-09 VITALS — BP 106/59 | HR 78 | Wt 152.3 lb

## 2017-03-09 DIAGNOSIS — Z130 Encounter for screening for diseases of the blood and blood-forming organs and certain disorders involving the immune mechanism: Secondary | ICD-10-CM

## 2017-03-09 DIAGNOSIS — Z3482 Encounter for supervision of other normal pregnancy, second trimester: Secondary | ICD-10-CM

## 2017-03-09 DIAGNOSIS — Z131 Encounter for screening for diabetes mellitus: Secondary | ICD-10-CM

## 2017-03-09 LAB — POCT URINALYSIS DIPSTICK
Bilirubin, UA: NEGATIVE
Glucose, UA: NEGATIVE
Ketones, UA: NEGATIVE
Nitrite, UA: NEGATIVE
Protein, UA: NEGATIVE
Spec Grav, UA: 1.025 (ref 1.030–1.035)
Urobilinogen, UA: NEGATIVE (ref ?–2.0)
pH, UA: 6.5 (ref 5.0–8.0)

## 2017-03-09 NOTE — Progress Notes (Signed)
ROB: Foing well, no major complaints.  RTC in 4 weeks, for 28 week labs at that time.

## 2017-04-06 ENCOUNTER — Ambulatory Visit (INDEPENDENT_AMBULATORY_CARE_PROVIDER_SITE_OTHER): Payer: BLUE CROSS/BLUE SHIELD | Admitting: Obstetrics and Gynecology

## 2017-04-06 ENCOUNTER — Other Ambulatory Visit: Payer: BLUE CROSS/BLUE SHIELD

## 2017-04-06 ENCOUNTER — Encounter: Payer: Self-pay | Admitting: Obstetrics and Gynecology

## 2017-04-06 VITALS — BP 95/61 | HR 92 | Wt 159.2 lb

## 2017-04-06 DIAGNOSIS — Z23 Encounter for immunization: Secondary | ICD-10-CM | POA: Diagnosis not present

## 2017-04-06 DIAGNOSIS — Z3482 Encounter for supervision of other normal pregnancy, second trimester: Secondary | ICD-10-CM

## 2017-04-06 DIAGNOSIS — Z131 Encounter for screening for diabetes mellitus: Secondary | ICD-10-CM

## 2017-04-06 DIAGNOSIS — Z3492 Encounter for supervision of normal pregnancy, unspecified, second trimester: Secondary | ICD-10-CM

## 2017-04-06 LAB — POCT URINALYSIS DIPSTICK
Bilirubin, UA: NEGATIVE
Blood, UA: NEGATIVE
Glucose, UA: NEGATIVE
Ketones, UA: NEGATIVE
Nitrite, UA: NEGATIVE
Protein, UA: NEGATIVE
Spec Grav, UA: 1.015 (ref 1.010–1.025)
Urobilinogen, UA: 0.2 E.U./dL
pH, UA: 7 (ref 5.0–8.0)

## 2017-04-06 NOTE — Progress Notes (Signed)
ROB:  Patient reports she is doing well. Reports very active baby.1hr GCT today, Tdap, blood consent, H&H.

## 2017-04-07 LAB — GLUCOSE, 1 HOUR GESTATIONAL: Gestational Diabetes Screen: 143 mg/dL — ABNORMAL HIGH (ref 65–139)

## 2017-04-09 ENCOUNTER — Telehealth: Payer: Self-pay

## 2017-04-09 DIAGNOSIS — R7309 Other abnormal glucose: Secondary | ICD-10-CM

## 2017-04-09 NOTE — Telephone Encounter (Signed)
-----   Message from Hildred LaserAnika Cherry, MD sent at 04/08/2017  4:14 PM EDT ----- Please inform of abnormal 1 hr glucola, needs 3 hr testing

## 2017-04-09 NOTE — Telephone Encounter (Signed)
Called pt no answer LM for pt informing her of the need for 3hr GTT, order placed. Advised pt to call back to be added to lab schedule. Advised pt NPO after midnight the night before screening.

## 2017-04-16 ENCOUNTER — Other Ambulatory Visit: Payer: BLUE CROSS/BLUE SHIELD

## 2017-04-16 DIAGNOSIS — R7309 Other abnormal glucose: Secondary | ICD-10-CM

## 2017-04-16 DIAGNOSIS — Z3482 Encounter for supervision of other normal pregnancy, second trimester: Secondary | ICD-10-CM

## 2017-04-16 DIAGNOSIS — Z130 Encounter for screening for diseases of the blood and blood-forming organs and certain disorders involving the immune mechanism: Secondary | ICD-10-CM

## 2017-04-17 LAB — GESTATIONAL GLUCOSE TOLERANCE
Glucose, Fasting: 82 mg/dL (ref 65–94)
Glucose, GTT - 1 Hour: 159 mg/dL (ref 65–179)
Glucose, GTT - 2 Hour: 119 mg/dL (ref 65–154)
Glucose, GTT - 3 Hour: 127 mg/dL (ref 65–139)

## 2017-04-17 LAB — CBC
Hematocrit: 31.9 % — ABNORMAL LOW (ref 34.0–46.6)
Hemoglobin: 10.5 g/dL — ABNORMAL LOW (ref 11.1–15.9)
MCH: 29.1 pg (ref 26.6–33.0)
MCHC: 32.9 g/dL (ref 31.5–35.7)
MCV: 88 fL (ref 79–97)
Platelets: 166 10*3/uL (ref 150–379)
RBC: 3.61 x10E6/uL — ABNORMAL LOW (ref 3.77–5.28)
RDW: 12.8 % (ref 12.3–15.4)
WBC: 9.9 10*3/uL (ref 3.4–10.8)

## 2017-05-01 ENCOUNTER — Encounter: Payer: BLUE CROSS/BLUE SHIELD | Admitting: Obstetrics and Gynecology

## 2017-05-04 ENCOUNTER — Ambulatory Visit (INDEPENDENT_AMBULATORY_CARE_PROVIDER_SITE_OTHER): Payer: BLUE CROSS/BLUE SHIELD | Admitting: Obstetrics and Gynecology

## 2017-05-04 VITALS — BP 101/61 | HR 98 | Wt 164.1 lb

## 2017-05-04 DIAGNOSIS — R7309 Other abnormal glucose: Secondary | ICD-10-CM

## 2017-05-04 DIAGNOSIS — O99013 Anemia complicating pregnancy, third trimester: Secondary | ICD-10-CM

## 2017-05-04 DIAGNOSIS — Z3483 Encounter for supervision of other normal pregnancy, third trimester: Secondary | ICD-10-CM

## 2017-05-04 DIAGNOSIS — R7302 Impaired glucose tolerance (oral): Secondary | ICD-10-CM

## 2017-05-04 LAB — POCT URINALYSIS DIPSTICK
Bilirubin, UA: NEGATIVE
Glucose, UA: NEGATIVE
Ketones, UA: NEGATIVE
Nitrite, UA: NEGATIVE
Protein, UA: NEGATIVE
Spec Grav, UA: 1.01 (ref 1.010–1.025)
Urobilinogen, UA: 0.2 E.U./dL
pH, UA: 7.5 (ref 5.0–8.0)

## 2017-05-04 NOTE — Progress Notes (Signed)
ROB: Doing well, notes increased discharge but no itching/burning, color change.  Had abnormal 1 hr glucola with normal 3 hr.  Mild anemia, encouraged daily iron supplement. RTC in 2 weeks.

## 2017-05-06 DIAGNOSIS — Z3483 Encounter for supervision of other normal pregnancy, third trimester: Secondary | ICD-10-CM | POA: Insufficient documentation

## 2017-05-06 DIAGNOSIS — R7309 Other abnormal glucose: Secondary | ICD-10-CM | POA: Insufficient documentation

## 2017-05-06 DIAGNOSIS — O99013 Anemia complicating pregnancy, third trimester: Secondary | ICD-10-CM | POA: Insufficient documentation

## 2017-05-18 ENCOUNTER — Ambulatory Visit (INDEPENDENT_AMBULATORY_CARE_PROVIDER_SITE_OTHER): Payer: BLUE CROSS/BLUE SHIELD | Admitting: Obstetrics and Gynecology

## 2017-05-18 VITALS — BP 87/61 | HR 96 | Wt 167.5 lb

## 2017-05-18 DIAGNOSIS — O2653 Maternal hypotension syndrome, third trimester: Secondary | ICD-10-CM

## 2017-05-18 DIAGNOSIS — Z3483 Encounter for supervision of other normal pregnancy, third trimester: Secondary | ICD-10-CM

## 2017-05-18 LAB — POCT URINALYSIS DIPSTICK
Bilirubin, UA: NEGATIVE
Glucose, UA: NEGATIVE
Ketones, UA: NEGATIVE
Nitrite, UA: NEGATIVE
Protein, UA: NEGATIVE
Spec Grav, UA: 1.01 (ref 1.010–1.025)
Urobilinogen, UA: 0.2 E.U./dL
pH, UA: 7 (ref 5.0–8.0)

## 2017-05-18 NOTE — Progress Notes (Signed)
ROB: Patient notes complaints of occasional dizziness and near syncopal episodes (moreso with changing positions).  BPs lower than normal. Discussed that this was normal in pregnancy, but to be careful in changing positions, increase hydration, and can occasionally incorporate salty foods into diet, such as peanuts, pretzels, chips). RTC in 2 weeks.

## 2017-05-18 NOTE — Patient Instructions (Addendum)

## 2017-05-19 DIAGNOSIS — O2653 Maternal hypotension syndrome, third trimester: Secondary | ICD-10-CM | POA: Insufficient documentation

## 2017-06-04 ENCOUNTER — Encounter: Payer: Self-pay | Admitting: Obstetrics and Gynecology

## 2017-06-04 ENCOUNTER — Ambulatory Visit (INDEPENDENT_AMBULATORY_CARE_PROVIDER_SITE_OTHER): Payer: BLUE CROSS/BLUE SHIELD | Admitting: Obstetrics and Gynecology

## 2017-06-04 VITALS — BP 96/60 | HR 96 | Wt 170.4 lb

## 2017-06-04 DIAGNOSIS — Z3493 Encounter for supervision of normal pregnancy, unspecified, third trimester: Secondary | ICD-10-CM

## 2017-06-04 LAB — POCT URINALYSIS DIPSTICK
Bilirubin, UA: NEGATIVE
Blood, UA: NEGATIVE
Glucose, UA: NEGATIVE
Ketones, UA: NEGATIVE
Leukocytes, UA: NEGATIVE
Nitrite, UA: NEGATIVE
Protein, UA: NEGATIVE
Spec Grav, UA: <=1.005 — AB (ref 1.010–1.025)
Urobilinogen, UA: 0.2 E.U./dL
pH, UA: 7 (ref 5.0–8.0)

## 2017-06-04 NOTE — Progress Notes (Signed)
ROB:  No problems. GC/CT GBS done. 

## 2017-06-06 LAB — GC/CHLAMYDIA PROBE AMP
Chlamydia trachomatis, NAA: NEGATIVE
Neisseria gonorrhoeae by PCR: NEGATIVE

## 2017-06-06 LAB — STREP GP B NAA: Strep Gp B NAA: POSITIVE — AB

## 2017-06-11 ENCOUNTER — Ambulatory Visit (INDEPENDENT_AMBULATORY_CARE_PROVIDER_SITE_OTHER): Payer: BLUE CROSS/BLUE SHIELD | Admitting: Obstetrics and Gynecology

## 2017-06-11 VITALS — BP 97/63 | HR 88 | Wt 170.9 lb

## 2017-06-11 DIAGNOSIS — O99820 Streptococcus B carrier state complicating pregnancy: Secondary | ICD-10-CM | POA: Insufficient documentation

## 2017-06-11 DIAGNOSIS — Z3483 Encounter for supervision of other normal pregnancy, third trimester: Secondary | ICD-10-CM

## 2017-06-11 LAB — POCT URINALYSIS DIPSTICK
Bilirubin, UA: NEGATIVE
Glucose, UA: NEGATIVE
Ketones, UA: NEGATIVE
Nitrite, UA: NEGATIVE
Protein, UA: NEGATIVE
Spec Grav, UA: 1.01 (ref 1.010–1.025)
Urobilinogen, UA: 0.2 E.U./dL
pH, UA: 7 (ref 5.0–8.0)

## 2017-06-11 NOTE — Patient Instructions (Addendum)
Group B Streptococcus Infection During Pregnancy Group B Streptococcus (GBS) is a type of bacteria (Streptococcus agalactiae) that is often found in healthy people, commonly in the rectum, vagina, and intestines. In people who are healthy and not pregnant, the bacteria rarely cause serious illness or complications. However, women who test positive for GBS during pregnancy can pass the bacteria to their baby during childbirth, which can cause serious infection in the baby after birth. Women with GBS may also have infections during their pregnancy or immediately after childbirth, such as such as urinary tract infections (UTIs) or infections of the uterus (uterine infections). Having GBS also increases a woman's risk of complications during pregnancy, such as early (preterm) labor or delivery, miscarriage, or stillbirth. Routine testing (screening) for GBS is recommended for all pregnant women. What increases the risk? You may have a higher risk for GBS infection during pregnancy if you had one during a past pregnancy. What are the signs or symptoms? In most cases, GBS infection does not cause symptoms in pregnant women. Signs and symptoms of a possible GBS-related infection may include:  Labor starting before the 37th week of pregnancy.  A UTI or bladder infection, which may cause: ? Fever. ? Pain or burning during urination. ? Frequent urination.  Fever during labor, along with: ? Bad-smelling discharge. ? Uterine tenderness. ? Rapid heartbeat in the mother, baby, or both.  Rare but serious symptoms of a possible GBS-related infection in women include:  Blood infection (septicemia). This may cause fever, chills, or confusion.  Lung infection (pneumonia). This may cause fever, chills, cough, rapid breathing, difficulty breathing, or chest pain.  Bone, joint, skin, or soft tissue infection.  How is this diagnosed? You may be screened for GBS between week 35 and week 37 of your pregnancy. If  you have symptoms of preterm labor, you may be screened earlier. This condition is diagnosed based on lab test results from:  A swab of fluid from the vagina and rectum.  A urine sample.  How is this treated? This condition is treated with antibiotic medicine. When you go into labor, or as soon as your water breaks (your membranes rupture), you will be given antibiotics through an IV tube. Antibiotics will continue until after you give birth. If you are having a cesarean delivery, you do not need antibiotics unless your membranes have already ruptured. Follow these instructions at home:  Take over-the-counter and prescription medicines only as told by your health care provider.  Take your antibiotic medicine as told by your health care provider. Do not stop taking the antibiotic even if you start to feel better.  Keep all pre-birth (prenatal) visits and follow-up visits as told by your health care provider. This is important. Contact a health care provider if:  You have pain or burning when you urinate.  You have to urinate frequently.  You have a fever or chills.  You develop a bad-smelling vaginal discharge. Get help right away if:  Your membranes rupture.  You go into labor.  You have severe pain in your abdomen.  You have difficulty breathing.  You have chest pain. This information is not intended to replace advice given to you by your health care provider. Make sure you discuss any questions you have with your health care provider. Document Released: 02/27/2008 Document Revised: 06/16/2016 Document Reviewed: 06/15/2016 Elsevier Interactive Patient Education  2018 [employer]  

## 2017-06-11 NOTE — Progress Notes (Addendum)
ROB: Notes feeling more pelvic pressure, and occasional ctx.  Declines exam today. Discussed labor precautions.  Declines circumcision for female infant. Discussed GBS+ status and need for antibiotics in labor.  RTC in 1 week.

## 2017-06-19 ENCOUNTER — Encounter: Payer: Self-pay | Admitting: Obstetrics and Gynecology

## 2017-06-19 ENCOUNTER — Ambulatory Visit (INDEPENDENT_AMBULATORY_CARE_PROVIDER_SITE_OTHER): Payer: BLUE CROSS/BLUE SHIELD | Admitting: Obstetrics and Gynecology

## 2017-06-19 VITALS — BP 103/65 | HR 102 | Wt 173.6 lb

## 2017-06-19 DIAGNOSIS — Z3493 Encounter for supervision of normal pregnancy, unspecified, third trimester: Secondary | ICD-10-CM

## 2017-06-19 DIAGNOSIS — Z3483 Encounter for supervision of other normal pregnancy, third trimester: Secondary | ICD-10-CM

## 2017-06-19 LAB — POCT URINALYSIS DIPSTICK
Bilirubin, UA: NEGATIVE
Blood, UA: NEGATIVE
Glucose, UA: NEGATIVE
Ketones, UA: NEGATIVE
Leukocytes, UA: NEGATIVE
Nitrite, UA: NEGATIVE
Protein, UA: NEGATIVE
Spec Grav, UA: 1.01 (ref 1.010–1.025)
Urobilinogen, UA: 0.2 E.U./dL
pH, UA: 5 (ref 5.0–8.0)

## 2017-06-19 NOTE — Progress Notes (Signed)
ROB: S&S discussed.  Irregular contractions.

## 2017-06-25 ENCOUNTER — Ambulatory Visit (INDEPENDENT_AMBULATORY_CARE_PROVIDER_SITE_OTHER): Payer: BLUE CROSS/BLUE SHIELD | Admitting: Obstetrics and Gynecology

## 2017-06-25 ENCOUNTER — Encounter: Payer: Self-pay | Admitting: Obstetrics and Gynecology

## 2017-06-25 VITALS — BP 87/56 | HR 102 | Wt 174.6 lb

## 2017-06-25 DIAGNOSIS — Z3483 Encounter for supervision of other normal pregnancy, third trimester: Secondary | ICD-10-CM

## 2017-06-25 DIAGNOSIS — Z3493 Encounter for supervision of normal pregnancy, unspecified, third trimester: Secondary | ICD-10-CM

## 2017-06-25 LAB — POCT URINALYSIS DIPSTICK
Bilirubin, UA: NEGATIVE
Glucose, UA: NEGATIVE
Ketones, UA: NEGATIVE
Nitrite, UA: NEGATIVE
Spec Grav, UA: 1.015 (ref 1.010–1.025)
Urobilinogen, UA: 0.2 E.U./dL
pH, UA: 6.5 (ref 5.0–8.0)

## 2017-06-25 NOTE — Progress Notes (Signed)
ROB- no complaints.  

## 2017-06-25 NOTE — Progress Notes (Signed)
ROB:  Doing well - rare contractions.  S&S reviewed.  GBS discussed.  Plan BPP next Thursday with NST the following Monday and  Induction at 41 weeks.  Discussed with pt.

## 2017-06-29 ENCOUNTER — Other Ambulatory Visit: Payer: Self-pay | Admitting: Obstetrics and Gynecology

## 2017-06-29 DIAGNOSIS — O480 Post-term pregnancy: Secondary | ICD-10-CM

## 2017-07-02 ENCOUNTER — Encounter: Payer: BLUE CROSS/BLUE SHIELD | Admitting: Obstetrics and Gynecology

## 2017-07-02 ENCOUNTER — Inpatient Hospital Stay
Admission: EM | Admit: 2017-07-02 | Discharge: 2017-07-03 | DRG: 775 | Disposition: A | Discharge status: Home / Self Care | Payer: BLUE CROSS/BLUE SHIELD | Attending: Obstetrics and Gynecology | Admitting: Obstetrics and Gynecology

## 2017-07-02 DIAGNOSIS — Z3493 Encounter for supervision of normal pregnancy, unspecified, third trimester: Secondary | ICD-10-CM | POA: Diagnosis present

## 2017-07-02 DIAGNOSIS — O99820 Streptococcus B carrier state complicating pregnancy: Secondary | ICD-10-CM | POA: Diagnosis not present

## 2017-07-02 DIAGNOSIS — O99824 Streptococcus B carrier state complicating childbirth: Principal | ICD-10-CM | POA: Diagnosis present

## 2017-07-02 DIAGNOSIS — Z3A39 39 weeks gestation of pregnancy: Secondary | ICD-10-CM

## 2017-07-02 HISTORY — DX: Other specified health status: Z78.9

## 2017-07-02 LAB — CBC
HCT: 41.4 % (ref 35.0–47.0)
Hemoglobin: 13.8 g/dL (ref 12.0–16.0)
MCH: 29 pg (ref 26.0–34.0)
MCHC: 33.4 g/dL (ref 32.0–36.0)
MCV: 86.8 fL (ref 80.0–100.0)
Platelets: 105 10*3/uL — ABNORMAL LOW (ref 150–440)
RBC: 4.77 MIL/uL (ref 3.80–5.20)
RDW: 18.6 % — ABNORMAL HIGH (ref 11.5–14.5)
WBC: 10.3 10*3/uL (ref 3.6–11.0)

## 2017-07-02 LAB — TYPE AND SCREEN
ABO/RH(D): B POS
Antibody Screen: NEGATIVE

## 2017-07-02 LAB — RAPID HIV SCREEN (HIV 1/2 AB+AG)
HIV 1/2 Antibodies: NONREACTIVE
HIV-1 P24 Antigen - HIV24: NONREACTIVE

## 2017-07-02 MED ORDER — OXYCODONE-ACETAMINOPHEN 5-325 MG PO TABS: 1.0000 | ORAL_TABLET | ORAL | Status: DC | PRN

## 2017-07-02 MED ORDER — OXYTOCIN BOLUS FROM INFUSION
500.0000 mL | Freq: Once | INTRAVENOUS | Status: AC
  Administered 2017-07-02: 500 mL via INTRAVENOUS

## 2017-07-02 MED ORDER — BENZOCAINE-MENTHOL 20-0.5 % EX AERO: 1.0000 "application " | INHALATION_SPRAY | CUTANEOUS | Status: DC | PRN

## 2017-07-02 MED ORDER — BUTORPHANOL TARTRATE 1 MG/ML IJ SOLN
1.0000 mg | INTRAMUSCULAR | Status: DC | PRN
  Administered 2017-07-02: 1 mg via INTRAVENOUS
  Filled 2017-07-02: qty 1

## 2017-07-02 MED ORDER — DIBUCAINE 1 % RE OINT: 1.0000 "application " | TOPICAL_OINTMENT | RECTAL | Status: DC | PRN

## 2017-07-02 MED ORDER — TETANUS-DIPHTH-ACELL PERTUSSIS 5-2.5-18.5 LF-MCG/0.5 IM SUSP: 0.5000 mL | Freq: Once | INTRAMUSCULAR | Status: DC

## 2017-07-02 MED ORDER — OXYCODONE-ACETAMINOPHEN 5-325 MG PO TABS: 2.0000 | ORAL_TABLET | ORAL | Status: DC | PRN

## 2017-07-02 MED ORDER — SOD CITRATE-CITRIC ACID 500-334 MG/5ML PO SOLN: 30.0000 mL | ORAL | Status: DC | PRN

## 2017-07-02 MED ORDER — FAMOTIDINE 20 MG PO TABS
10.0000 mg | ORAL_TABLET | Freq: Every day | ORAL | Status: DC
  Administered 2017-07-02: 10 mg via ORAL
  Filled 2017-07-02: qty 1

## 2017-07-02 MED ORDER — SODIUM CHLORIDE 0.9 % IV SOLN
2.0000 g | Freq: Once | INTRAVENOUS | Status: AC
  Administered 2017-07-02: 2 g via INTRAVENOUS
  Filled 2017-07-02: qty 2000

## 2017-07-02 MED ORDER — ZOLPIDEM TARTRATE 5 MG PO TABS: 5.0000 mg | ORAL_TABLET | Freq: Every evening | ORAL | Status: DC | PRN

## 2017-07-02 MED ORDER — SENNOSIDES-DOCUSATE SODIUM 8.6-50 MG PO TABS
2.0000 | ORAL_TABLET | ORAL | Status: DC
  Administered 2017-07-02: 2 via ORAL
  Filled 2017-07-02: qty 2

## 2017-07-02 MED ORDER — ONDANSETRON HCL 4 MG/2ML IJ SOLN: 4.0000 mg | Freq: Four times a day (QID) | INTRAMUSCULAR | Status: DC | PRN

## 2017-07-02 MED ORDER — ACETAMINOPHEN 325 MG PO TABS: 650.0000 mg | ORAL_TABLET | ORAL | Status: DC | PRN

## 2017-07-02 MED ORDER — LIDOCAINE HCL (PF) 1 % IJ SOLN
30.0000 mL | INTRAMUSCULAR | Status: DC | PRN
  Administered 2017-07-02: 30 mL via SUBCUTANEOUS

## 2017-07-02 MED ORDER — ONDANSETRON HCL 4 MG PO TABS: 4.0000 mg | ORAL_TABLET | ORAL | Status: DC | PRN

## 2017-07-02 MED ORDER — LACTATED RINGERS IV SOLN
500.0000 mL | INTRAVENOUS | Status: DC | PRN
Start: 2017-07-02 — End: 2017-07-02

## 2017-07-02 MED ORDER — WITCH HAZEL-GLYCERIN EX PADS: 1.0000 "application " | MEDICATED_PAD | CUTANEOUS | Status: DC | PRN

## 2017-07-02 MED ORDER — SODIUM CHLORIDE 0.9 % IV SOLN
1.0000 g | INTRAVENOUS | Status: DC
  Administered 2017-07-02: 1 g via INTRAVENOUS
  Filled 2017-07-02 (×7): qty 1000

## 2017-07-02 MED ORDER — OXYTOCIN 40 UNITS IN LACTATED RINGERS INFUSION - SIMPLE MED
2.5000 [IU]/h | INTRAVENOUS | Status: DC
  Filled 2017-07-02: qty 1000

## 2017-07-02 MED ORDER — MISOPROSTOL 200 MCG PO TABS
ORAL_TABLET | ORAL | Status: AC
  Filled 2017-07-02: qty 4

## 2017-07-02 MED ORDER — AMMONIA AROMATIC IN INHA
RESPIRATORY_TRACT | Status: AC
  Filled 2017-07-02: qty 10

## 2017-07-02 MED ORDER — LIDOCAINE HCL (PF) 1 % IJ SOLN
INTRAMUSCULAR | Status: AC
  Filled 2017-07-02: qty 30

## 2017-07-02 MED ORDER — PRENATAL MULTIVITAMIN CH
1.0000 | ORAL_TABLET | Freq: Every day | ORAL | Status: DC
  Administered 2017-07-03: 1 via ORAL
  Filled 2017-07-02: qty 1

## 2017-07-02 MED ORDER — SIMETHICONE 80 MG PO CHEW: 80.0000 mg | CHEWABLE_TABLET | ORAL | Status: DC | PRN

## 2017-07-02 MED ORDER — DIPHENHYDRAMINE HCL 25 MG PO CAPS: 25.0000 mg | ORAL_CAPSULE | Freq: Four times a day (QID) | ORAL | Status: DC | PRN

## 2017-07-02 MED ORDER — OXYTOCIN 10 UNIT/ML IJ SOLN
INTRAMUSCULAR | Status: AC
  Filled 2017-07-02: qty 2

## 2017-07-02 MED ORDER — IBUPROFEN 800 MG PO TABS
800.0000 mg | ORAL_TABLET | Freq: Four times a day (QID) | ORAL | Status: DC
  Administered 2017-07-02 – 2017-07-03 (×3): 800 mg via ORAL
  Filled 2017-07-02 (×3): qty 1

## 2017-07-02 MED ORDER — ONDANSETRON HCL 4 MG/2ML IJ SOLN: 4.0000 mg | INTRAMUSCULAR | Status: DC | PRN

## 2017-07-02 MED ORDER — LACTATED RINGERS IV SOLN
INTRAVENOUS | Status: DC
  Administered 2017-07-02: 08:00:00 via INTRAVENOUS

## 2017-07-02 MED ORDER — COCONUT OIL OIL: 1.0000 "application " | TOPICAL_OIL | Status: DC | PRN

## 2017-07-02 NOTE — Progress Notes (Signed)
Intrapartum Progress Note  S: Patient notes contractions 8-9/10 pain scale.  Declines anything for pain currently.   O: Blood pressure 116/85, pulse 81, temperature (!) 97.5 F (36.4 C), temperature source Oral, resp. rate 16, height 5\' 3"  (1.6 m), weight 174 lb (78.9 kg), last menstrual period 09/26/2016. Gen App: NAD, uncomfortable with contractions Abdomen: soft, gravid FHT: baseline 125 bpm.  Accels present.  Decels absent. Minimal to moderate variability.   Tocometer: contractions q 1-2 minutes Cervix: 6.5/90-100/-3 to -2/bulging bag Extremities: Nontender, no edema.  Labs: Pending CBC and other admission labs    Assessment:  1: SIUP at 3250w6d 2. GBS positive  Plan:  1. Continue expectant management. Analgesia as needed.  2. GBS positive, continue ampicillin for prophylaxis.    Hildred Laserherry, Dequane Strahan, MD 07/02/2017 9:56 AM

## 2017-07-02 NOTE — H&P (Addendum)
Obstetric History and Physical  Hannah Larson is a 26 y.o. G2P1001 with IUP at 6777w6d presenting for complaints of possible LOF at 0600 this morning (reports trickling), and  contractions. Patient states she has been having  regular, every 2-3 minutes contractions, none vaginal bleeding, with active fetal movement.    Prenatal Course Source of Care: [facility] with onset of care at 10 weeks Pregnancy complications or risks: Patient Active Problem List   Diagnosis Date Noted  . Labor and delivery indication for care or intervention 07/02/2017  . Group B streptococcal carriage complicating pregnancy 06/11/2017  . Maternal hypotension syndrome, third trimester 05/19/2017  . Abnormal glucose tolerance test 05/06/2017  . Anemia of pregnancy in third trimester 05/06/2017  . Supervision of normal intrauterine pregnancy in multigravida in third trimester 05/06/2017  . UTI in pregnancy, antepartum, first trimester 12/17/2016  . Rubella non-immune status, antepartum 12/17/2016  . Overweight (BMI 25.0-29.9) 12/17/2016  . History of nausea and vomiting 11/07/2016  . Amenorrhea 11/07/2016   She plans to bottle feed She desires oral contraceptives (estrogen/progesterone), unsure methods for postpartum contraception.   Prenatal labs and studies: ABO, Rh: --/--/B POS (07/30 0813) Antibody: Negative (01/02 1620) Rubella: <0.90 (01/02 1620) RPR: Non Reactive (01/02 1620)  HBsAg: Negative (01/02 1620)  HIV:  Negative (07/30 0813) BJY:NWGNFAOZGBS:Positive (07/02 1726) 1 hr Glucola abnormal (157) with normal 3 hr GTT Genetic screening declined Anatomy US normal   Past Medical History:  Diagnosis Date  . Known health problems: none     Past Surgical History:  Procedure Laterality Date  . NO PAST SURGERIES      OB History  Gravida Para Term Preterm AB Living  2 1 1     1   SAB TAB Ectopic Multiple Live Births          1    # Outcome Date GA Lbr Len/2nd Weight Sex Delivery Anes PTL Lv  2  Current           1 Term 2016   8 lb 9.6 oz (3.901 kg) M Vag-Spont   LIV      Social History   Social History  . Marital status: Married    Spouse name: N/A  . Number of children: N/A  . Years of education: N/A   Social History Main Topics  . Smoking status: Never Smoker  . Smokeless tobacco: Never Used  . Alcohol use No  . Drug use: No  . Sexual activity: Yes    Birth control/ protection: None     Comment: Undecided   Other Topics Concern  . None   Social History Narrative  . None    Family History  Problem Relation Age of Onset  . Diabetes Sister   . Breast cancer Neg Hx   . Migraines Neg Hx   . Colon cancer Neg Hx   . Heart disease Neg Hx     Prescriptions Prior to Admission  Medication Sig Dispense Refill Last Dose  . acetaminophen (TYLENOL) 325 MG tablet Take 650 mg by mouth every 6 (six) hours as needed.   Past Month at Unknown time  . Prenatal Vit-Fe Fumarate-FA (PRENATAL VITAMINS) 28-0.8 MG TABS Take 1 tablet by mouth daily.   Past Week at Unknown time    No Known Allergies  Review of Systems: Negative except for what is mentioned in HPI.  Physical Exam: BP 116/85   Pulse 81   Temp (!) 97.5 F (36.4 C) (Oral)   Resp 16  Ht 5\' 3"  (1.6 m)   Wt 174 lb (78.9 kg)   LMP 09/26/2016 (Exact Date)   BMI 30.82 kg/m  CONSTITUTIONAL: Well-developed, well-nourished female in no acute distress.  HENT:  Normocephalic, atraumatic, External right and left ear normal. Oropharynx is clear and moist EYES: Conjunctivae and EOM are normal. Pupils are equal, round, and reactive to light. No scleral icterus.  NECK: Normal range of motion, supple, no masses SKIN: Skin is warm and dry. No rash noted. Not diaphoretic. No erythema. No pallor. NEUROLOGIC: Alert and oriented to person, place, and time. Normal reflexes, muscle tone coordination. No cranial nerve deficit noted. PSYCHIATRIC: Normal mood and affect. Normal behavior. Normal judgment and thought  content. CARDIOVASCULAR: Normal heart rate noted, regular rhythm RESPIRATORY: Effort and breath sounds normal, no problems with respiration noted ABDOMEN: Soft, nontender, nondistended, gravid. MUSCULOSKELETAL: Normal range of motion. No edema and no tenderness. 2+ distal pulses.  Cervical Exam: Dilatation 5 cm   Effacement 90-100%   Station -3 bulging bag.  Nitrazine test negative.    Presentation: cephalic FHT:  Baseline rate 125 bpm   Variability moderate  Accelerations present   Decelerations none Contractions: Every 1-2 mins   Pertinent Labs/Studies:   Results for orders placed or performed during the hospital encounter of 07/02/17 (from the past 24 hour(s))  Type and screen [facility]     Status: None   Collection Time: 07/02/17  8:13 AM  Result Value Ref Range   ABO/RH(D) B POS    Antibody Screen NEG    Sample Expiration 07/05/2017     Assessment : Hannah Larson is a 26 y.o. G2P1001 at 7857w6d being admitted for labor.  GBS positive.   Plan: Labor: Expectant management for now.  Analgesia as needed. FWB: Reassuring fetal heart tracing.  GBS positive.  Will treat with Ampicillin.  Delivery plan: Hopeful for vaginal delivery   Hildred Laserherry, Mirel Hundal, MD [facility]

## 2017-07-02 NOTE — OB Triage Note (Signed)
Pt presents from ED stating her water broke around 0600 07/02/17. Pt states trickling fluid. ctx started about 0620 07/02/17 6/10 pain and coming every 5-6 min. Pt denies VB and states +FM.

## 2017-07-02 NOTE — Progress Notes (Signed)
Intrapartum Progress Note  S: Patient uncomfortable with contractions, received IV pain meds x 1.   O: Blood pressure 118/73, pulse 85, temperature 97.8 F (36.6 C), temperature source Oral, resp. rate 16, height 5\' 3"  (1.6 m), weight 174 lb (78.9 kg), last menstrual period 09/26/2016. Gen App: NAD, uncomfortable with contractions Abdomen: soft, gravid FHT: baseline 125 bpm.  Accels present.  Decels absent. Minimal to moderate variability.   Tocometer: contractions q 1-2 minutes Cervix: 9/100/-1 with bulging bag.  AROM with cervix 10/100/0 station. Clear fluid.  Extremities: Nontender, no edema.  Labs:  Results for orders placed or performed during the hospital encounter of 07/02/17  CBC  Result Value Ref Range   WBC 10.3 3.6 - 11.0 K/uL   RBC 4.77 3.80 - 5.20 MIL/uL   Hemoglobin 13.8 12.0 - 16.0 g/dL   HCT 16.141.4 09.635.0 - 04.547.0 %   MCV 86.8 80.0 - 100.0 fL   MCH 29.0 26.0 - 34.0 pg   MCHC 33.4 32.0 - 36.0 g/dL   RDW 40.918.6 (H) 81.111.5 - 91.414.5 %   Platelets 105 (L) 150 - 440 K/uL  Rapid HIV screen (HIV 1/2 Ab+Ag) ([facility] Only)  Result Value Ref Range   HIV-1 P24 Antigen - HIV24 NON REACTIVE NON REACTIVE   HIV 1/2 Antibodies NON REACTIVE NON REACTIVE   Interpretation (HIV Ag Ab)      A non reactive test result means that HIV 1 or HIV 2 antibodies and HIV 1 p24 antigen were not detected in the specimen.     Assessment:  1: SIUP at 4594w6d 2. GBS positive 3. Clear fluid at AROM.  Plan:  1. Continue expectant management. Analgesia as needed.  Anticipate vaginal delivery soon.  2. GBS positive, continue ampicillin for prophylaxis.    Hildred Laserherry, Enna Warwick, MD 07/02/2017 12:33 PM

## 2017-07-03 LAB — CBC
HCT: 33.7 % — ABNORMAL LOW (ref 35.0–47.0)
Hemoglobin: 11.4 g/dL — ABNORMAL LOW (ref 12.0–16.0)
MCH: 29.6 pg (ref 26.0–34.0)
MCHC: 33.8 g/dL (ref 32.0–36.0)
MCV: 87.6 fL (ref 80.0–100.0)
Platelets: 90 10*3/uL — ABNORMAL LOW (ref 150–440)
RBC: 3.84 MIL/uL (ref 3.80–5.20)
RDW: 18.6 % — ABNORMAL HIGH (ref 11.5–14.5)
WBC: 9.4 10*3/uL (ref 3.6–11.0)

## 2017-07-03 LAB — RPR: RPR Ser Ql: NONREACTIVE

## 2017-07-03 MED ORDER — IBUPROFEN 800 MG PO TABS: 800.0000 mg | ORAL_TABLET | Freq: Three times a day (TID) | ORAL | 0 refills | Status: DC | PRN

## 2017-07-03 NOTE — Progress Notes (Signed)
Reviewed all patients discharge instructions and handouts regarding postpartum bleeding, no intercourse for 6 weeks, signs and symptoms of mastitis and postpartum bleu's. Reviewed discharge instructions for newborn regarding proper cord care, how and when to bathe the newborn, nail care, proper way to take the baby's temperature, along with safe sleep. All questions have been answered at this time. Patient refused wheelchair and walked herself downstairs.

## 2017-07-03 NOTE — Progress Notes (Signed)
Post Partum Day # 1, s/p SVD  Subjective: no complaints, up ad lib, voiding and tolerating PO  Objective: Temp:  [97.6 F (36.4 C)-98.2 F (36.8 C)] 98.2 F (36.8 C) (07/30 2344) Pulse Rate:  [72-107] 79 (07/30 2344) Resp:  [18] 18 (07/30 2344) BP: (83-122)/(46-93) 110/72 (07/30 2344) SpO2:  [100 %] 100 % (07/30 1520)   Physical Exam:  General: alert and no distress  Lungs: clear to auscultation bilaterally Breasts: normal appearance, no masses or tenderness Heart: regular rate and rhythm, S1, S2 normal, no murmur, click, rub or gallop Abdomen: soft, non-tender; bowel sounds normal; no masses,  no organomegaly Pelvis: Lochia: appropriate, Uterine Fundus: firm Extremities: DVT Evaluation: No evidence of DVT seen on physical exam. Negative Homan's sign. No cords or calf tenderness. No significant calf/ankle edema.   Recent Labs  07/02/17 0813 07/03/17 0508  HGB 13.8 11.4*  HCT 41.4 33.7*    Assessment/Plan: Breastfeeding and Contraception undecided.  Plan to d/c home today or tomorrow depending on infant status.  Declines circumcision for female infant.    LOS: 1 day   Hildred Laserherry, Jax Kentner, MD [facility] 07/03/2017 7:46 AM

## 2017-07-03 NOTE — Discharge Summary (Addendum)
Obstetric Discharge Summary Reason for Admission: onset of labor at 39.6 weeks Prenatal Procedures: ultrasound Intrapartum Procedures: spontaneous vaginal delivery Postpartum Procedures: none Complications-Operative and Postpartum: 2nd degree perineal laceration   Hemoglobin  Date Value Ref Range Status  07/03/2017 11.4 (L) 12.0 - 16.0 g/dL Final  29/56/213005/14/2018 86.510.5 (L) 11.1 - 15.9 g/dL Final   HCT  Date Value Ref Range Status  07/03/2017 33.7 (L) 35.0 - 47.0 % Final   Hematocrit  Date Value Ref Range Status  04/16/2017 31.9 (L) 34.0 - 46.6 % Final    Physical Exam:  Vitals:   07/03/17 1200 07/03/17 1558  BP: 110/65   Pulse: 99   Resp: 20   Temp: 98 F (36.7 C) 98.4 F (36.9 C)   General: alert and no distress Lochia: appropriate Uterine Fundus: firm Incision: none DVT Evaluation: No evidence of DVT seen on physical exam. Negative Homan's sign.  No cords or calf tenderness. No significant calf/ankle edema.  Discharge Diagnoses: Term Pregnancy-delivered  Discharge Information: Date: 07/03/2017 Activity: pelvic rest Diet: routine Medications: PNV and Ibuprofen Condition: stable Instructions: refer to practice specific booklet Discharge to: home   Newborn Data: Live born female  Birth Weight: 9 lb 14.4 oz (4490 g) APGAR: 9, 9  Home with mother.  Hannah Lasernika Jaleisa Larson 07/03/2017, 5:47 PM

## 2017-07-05 ENCOUNTER — Other Ambulatory Visit: Payer: BLUE CROSS/BLUE SHIELD

## 2017-07-06 ENCOUNTER — Telehealth: Payer: Self-pay | Admitting: Obstetrics and Gynecology

## 2017-07-06 NOTE — Telephone Encounter (Signed)
Patient lvm to schedule a 6 wk post partum visit. I called the patient back and lvm for patient to call back and schedule an appointment. Thank you.

## 2017-07-09 ENCOUNTER — Encounter: Payer: BLUE CROSS/BLUE SHIELD | Admitting: Obstetrics and Gynecology

## 2017-07-09 ENCOUNTER — Other Ambulatory Visit: Payer: BLUE CROSS/BLUE SHIELD

## 2017-08-08 ENCOUNTER — Ambulatory Visit (INDEPENDENT_AMBULATORY_CARE_PROVIDER_SITE_OTHER): Payer: BLUE CROSS/BLUE SHIELD | Admitting: Obstetrics and Gynecology

## 2017-08-08 ENCOUNTER — Encounter: Payer: Self-pay | Admitting: Obstetrics and Gynecology

## 2017-08-08 DIAGNOSIS — Z30011 Encounter for initial prescription of contraceptive pills: Secondary | ICD-10-CM

## 2017-08-08 MED ORDER — NORETHINDRONE ACET-ETHINYL EST 1-20 MG-MCG PO TABS: 1.0000 | ORAL_TABLET | Freq: Every day | ORAL | 11 refills | Status: DC

## 2017-08-08 NOTE — Patient Instructions (Signed)
Oral Contraception Information Oral contraceptive pills (OCPs) are medicines taken to prevent pregnancy. OCPs work by preventing the ovaries from releasing eggs. The hormones in OCPs also cause the cervical mucus to thicken, preventing the sperm from entering the uterus. The hormones also cause the uterine lining to become thin, not allowing a fertilized egg to attach to the inside of the uterus. OCPs are highly effective when taken exactly as prescribed. However, OCPs do not prevent sexually transmitted diseases (STDs). Safe sex practices, such as using condoms along with the pill, can help prevent STDs. Before taking the pill, you may have a physical exam and Pap test. Your health care provider may order blood tests. The health care provider will make sure you are a good candidate for oral contraception. Discuss with your health care provider the possible side effects of the OCP you may be prescribed. When starting an OCP, it can take 2 to 3 months for the body to adjust to the changes in hormone levels in your body. Types of oral contraception  The combination pill-This pill contains estrogen and progestin (synthetic progesterone) hormones. The combination pill comes in 21-day, 28-day, or 91-day packs. Some types of combination pills are meant to be taken continuously (365-day pills). With 21-day packs, you do not take pills for 7 days after the last pill. With 28-day packs, the pill is taken every day. The last 7 pills are without hormones. Certain types of pills have more than 21 hormone-containing pills. With 91-day packs, the first 84 pills contain both hormones, and the last 7 pills contain no hormones or contain estrogen only.  The minipill-This pill contains the progesterone hormone only. The pill is taken every day continuously. It is very important to take the pill at the same time each day. The minipill comes in packs of 28 pills. All 28 pills contain the hormone. Advantages of oral  contraceptive pills  Decreases premenstrual symptoms.  Treats menstrual period cramps.  Regulates the menstrual cycle.  Decreases a heavy menstrual flow.  May treatacne, depending on the type of pill.  Treats abnormal uterine bleeding.  Treats polycystic ovarian syndrome.  Treats endometriosis.  Can be used as emergency contraception. Things that can make oral contraceptive pills less effective OCPs can be less effective if:  You forget to take the pill at the same time every day.  You have a stomach or intestinal disease that lessens the absorption of the pill.  You take OCPs with other medicines that make OCPs less effective, such as antibiotics, certain HIV medicines, and some seizure medicines.  You take expired OCPs.  You forget to restart the pill on day 7, when using the packs of 21 pills.  Risks associated with oral contraceptive pills Oral contraceptive pills can sometimes cause side effects, such as:  Headache.  Nausea.  Breast tenderness.  Irregular bleeding or spotting.  Combination pills are also associated with a small increased risk of:  Blood clots.  Heart attack.  Stroke.  This information is not intended to replace advice given to you by your health care provider. Make sure you discuss any questions you have with your health care provider. Document Released: 02/10/2003 Document Revised: 04/27/2016 Document Reviewed: 05/11/2013 Elsevier Interactive Patient Education  2018 [employer]  

## 2017-08-08 NOTE — Progress Notes (Signed)
   OBSTETRICS POSTPARTUM CLINIC PROGRESS NOTE  Subjective:     Hannah Larson is a 26 y.o. [id number] female who presents for a postpartum visit. She is 6 weeks postpartum following a spontaneous vaginal delivery. I have fully reviewed the prenatal and intrapartum course. The delivery was at 39.6 gestational weeks.  Anesthesia: IV sedation. Postpartum course has been well. Baby's course has been well. Baby is feeding by bottle - Similac Advance. Bleeding: patient has not resumed menses, with No LMP recorded.. Bowel function is normal. Bladder function is normal. Patient is not sexually active. Contraception method desired is OCP (estrogen/progesterone). Postpartum depression screening: negative (Edinburgh Postnatal Depression Scale score was 0).  The following portions of the patient's history were reviewed and updated as appropriate: allergies, current medications, past family history, past medical history, past social history, past surgical history and problem list.  Review of Systems Pertinent items noted in HPI and remainder of comprehensive ROS otherwise negative.   Objective:    BP 103/70 (BP Location: Left Arm, Patient Position: Sitting, Cuff Size: Normal)   Pulse 66   Ht 5\' 3"  (1.6 m)   Wt 145 lb (65.8 kg)   Breastfeeding? No   BMI 25.69 kg/m   General:  alert and no distress   Breasts:  inspection negative, no nipple discharge or bleeding, no masses or nodularity palpable  Lungs: clear to auscultation bilaterally  Heart:  regular rate and rhythm, S1, S2 normal, no murmur, click, rub or gallop  Abdomen: soft, non-tender; bowel sounds normal; no masses,  no organomegaly.   Vulva:  normal  Vagina: normal vagina, no discharge, exudate, lesion, or erythema  Cervix:  no cervical motion tenderness and no lesions  Corpus: normal size, contour, position, consistency, mobility, non-tender  Adnexa:  normal adnexa and no mass, fullness, tenderness  Rectal Exam: Not performed.         Labs:    Lab Results  Component Value Date   HGB 11.4 (L) 07/03/2017     Assessment:    Routine postpartum exam.   Contraception management   Plan:   1. Doing well postpartum  2.Contraception: OCP (estrogen/progesterone).  Prescribed Junel.  Advised on back up method for the first month of OCP use.    3. Follow up in: 6 months for annual exam or as needed.    Hildred Laserherry, Hannah Rivere, MD [facility]

## 2017-08-10 ENCOUNTER — Encounter: Payer: BLUE CROSS/BLUE SHIELD | Admitting: Obstetrics and Gynecology

## 2018-02-20 ENCOUNTER — Ambulatory Visit (INDEPENDENT_AMBULATORY_CARE_PROVIDER_SITE_OTHER): Payer: BLUE CROSS/BLUE SHIELD | Admitting: Obstetrics and Gynecology

## 2018-02-20 ENCOUNTER — Encounter: Payer: Self-pay | Admitting: Obstetrics and Gynecology

## 2018-02-20 VITALS — BP 107/72 | HR 84 | Ht 62.0 in | Wt 150.3 lb

## 2018-02-20 DIAGNOSIS — Z30011 Encounter for initial prescription of contraceptive pills: Secondary | ICD-10-CM | POA: Diagnosis not present

## 2018-02-20 DIAGNOSIS — Z01419 Encounter for gynecological examination (general) (routine) without abnormal findings: Secondary | ICD-10-CM

## 2018-02-20 DIAGNOSIS — E663 Overweight: Secondary | ICD-10-CM | POA: Diagnosis not present

## 2018-02-20 DIAGNOSIS — Z862 Personal history of diseases of the blood and blood-forming organs and certain disorders involving the immune mechanism: Secondary | ICD-10-CM

## 2018-02-20 DIAGNOSIS — N898 Other specified noninflammatory disorders of vagina: Secondary | ICD-10-CM | POA: Diagnosis not present

## 2018-02-20 MED ORDER — NORETHIN-ETH ESTRAD-FE BIPHAS 1 MG-10 MCG / 10 MCG PO TABS: 1.0000 | ORAL_TABLET | Freq: Every day | ORAL | 11 refills | Status: DC

## 2018-02-20 NOTE — Progress Notes (Signed)
Pt is doing well no concerns want to discuss forms of birth control.

## 2018-02-20 NOTE — Patient Instructions (Signed)
Ethinyl Estradiol; Norethindrone Acetate; Ferrous fumarate tablets or capsules What is this medicine? ETHINYL ESTRADIOL; NORETHINDRONE ACETATE; FERROUS FUMARATE (ETH in il es tra DYE ole; nor eth IN drone AS e tate; FER us FUE ma rate) is an oral contraceptive. The products combine two types of female hormones, an estrogen and a progestin. They are used to prevent ovulation and pregnancy. Some products are also used to treat acne in females. This medicine may be used for other purposes; ask your health care provider or pharmacist if you have questions. COMMON BRAND NAME(S): Blisovi 24 Fe, Blisovi Fe, Estrostep Fe, Gildess 24 Fe, Gildess Fe 1.5/30, Gildess Fe 1/20, Junel Fe 1.5/30, Junel Fe 1/20, Junel Fe 24, Larin Fe, Lo Loestrin Fe, Loestrin 24 Fe, Loestrin FE 1.5/30, Loestrin FE 1/20, Lomedia 24 Fe, Microgestin 24 Fe, Microgestin Fe 1.5/30, Microgestin Fe 1/20, Tarina Fe 1/20, Taytulla, Tilia Fe, Tri-Legest Fe What should I tell my health care provider before I take this medicine? They need to know if you have any of these conditions: -abnormal vaginal bleeding -blood vessel disease -breast, cervical, endometrial, ovarian, liver, or uterine cancer -diabetes -gallbladder disease -heart disease or recent heart attack -high blood pressure -high cholesterol -history of blood clots -kidney disease -liver disease -migraine headaches -smoke tobacco -stroke -systemic lupus erythematosus (SLE) -an unusual or allergic reaction to estrogens, progestins, other medicines, foods, dyes, or preservatives -pregnant or trying to get pregnant -breast-feeding How should I use this medicine? Take this medicine by mouth. To reduce nausea, this medicine may be taken with food. Follow the directions on the prescription label. Take this medicine at the same time each day and in the order directed on the package. Do not take your medicine more often than directed. A patient package insert for the product will be  given with each prescription and refill. Read this sheet carefully each time. The sheet may change frequently. Contact your pediatrician regarding the use of this medicine in children. Special care may be needed. This medicine has been used in female children who have started having menstrual periods. Overdosage: If you think you have taken too much of this medicine contact a poison control center or emergency room at once. NOTE: This medicine is only for you. Do not share this medicine with others. What if I miss a dose? If you miss a dose, refer to the patient information sheet you received with your medicine for direction. If you miss more than one pill, this medicine may not be as effective and you may need to use another form of birth control. What may interact with this medicine? Do not take this medicine with the following medication: -dasabuvir; ombitasvir; paritaprevir; ritonavir -ombitasvir; paritaprevir; ritonavir This medicine may also interact with the following medications: -acetaminophen -antibiotics or medicines for infections, especially rifampin, rifabutin, rifapentine, and griseofulvin, and possibly penicillins or tetracyclines -aprepitant -ascorbic acid (vitamin C) -atorvastatin -barbiturate medicines, such as phenobarbital -bosentan -carbamazepine -caffeine -clofibrate -cyclosporine -dantrolene -doxercalciferol -felbamate -grapefruit juice -hydrocortisone -medicines for anxiety or sleeping problems, such as diazepam or temazepam -medicines for diabetes, including pioglitazone -mineral oil -modafinil -mycophenolate -nefazodone -oxcarbazepine -phenytoin -prednisolone -ritonavir or other medicines for HIV infection or AIDS -rosuvastatin -selegiline -soy isoflavones supplements -St. John's wort -tamoxifen or raloxifene -theophylline -thyroid hormones -topiramate -warfarin This list may not describe all possible interactions. Give your health care  provider a list of all the medicines, herbs, non-prescription drugs, or dietary supplements you use. Also tell them if you smoke, drink alcohol, or use illegal drugs. Some   items may interact with your medicine. What should I watch for while using this medicine? Visit your doctor or health care professional for regular checks on your progress. You will need a regular breast and pelvic exam and Pap smear while on this medicine. Use an additional method of contraception during the first cycle that you take these tablets. If you have any reason to think you are pregnant, stop taking this medicine right away and contact your doctor or health care professional. If you are taking this medicine for hormone related problems, it may take several cycles of use to see improvement in your condition. Smoking increases the risk of getting a blood clot or having a stroke while you are taking birth control pills, especially if you are more than 29 years old. You are strongly advised not to smoke. This medicine can make your body retain fluid, making your fingers, hands, or ankles swell. Your blood pressure can go up. Contact your doctor or health care professional if you feel you are retaining fluid. This medicine can make you more sensitive to the sun. Keep out of the sun. If you cannot avoid being in the sun, wear protective clothing and use sunscreen. Do not use sun lamps or tanning beds/booths. If you wear contact lenses and notice visual changes, or if the lenses begin to feel uncomfortable, consult your eye care specialist. In some women, tenderness, swelling, or minor bleeding of the gums may occur. Notify your dentist if this happens. Brushing and flossing your teeth regularly may help limit this. See your dentist regularly and inform your dentist of the medicines you are taking. If you are going to have elective surgery, you may need to stop taking this medicine before the surgery. Consult your health care  professional for advice. This medicine does not protect you against HIV infection (AIDS) or any other sexually transmitted diseases. What side effects may I notice from receiving this medicine? Side effects that you should report to your doctor or health care professional as soon as possible: -allergic reactions like skin rash, itching or hives, swelling of the face, lips, or tongue -breast tissue changes or discharge -changes in vaginal bleeding during your period or between your periods -changes in vision -chest pain -confusion -coughing up blood -dizziness -feeling faint or lightheaded -headaches or migraines -leg, arm or groin pain -loss of balance or coordination -severe or sudden headaches -stomach pain (severe) -sudden shortness of breath -sudden numbness or weakness of the face, arm or leg -symptoms of vaginal infection like itching, irritation or unusual discharge -tenderness in the upper abdomen -trouble speaking or understanding -vomiting -yellowing of the eyes or skin Side effects that usually do not require medical attention (report to your doctor or health care professional if they continue or are bothersome): -breakthrough bleeding and spotting that continues beyond the 3 initial cycles of pills -breast tenderness -mood changes, anxiety, depression, frustration, anger, or emotional outbursts -increased sensitivity to sun or ultraviolet light -nausea -skin rash, acne, or brown spots on the skin -weight gain (slight) This list may not describe all possible side effects. Call your doctor for medical advice about side effects. You may report side effects to FDA at 1-800-FDA-1088. Where should I keep my medicine? Keep out of the reach of children. Store at room temperature between 15 and 30 degrees C (59 and 86 degrees F). Throw away any unused medicine after the expiration date. NOTE: This sheet is a summary. It may not cover all possible information. If you   have  questions about this medicine, talk to your doctor, pharmacist, or health care provider.  2018 Elsevier/Gold Standard (2016-07-31 08:04:41)   Health Maintenance, Female Adopting a healthy lifestyle and getting preventive care can go a long way to promote health and wellness. Talk with your health care provider about what schedule of regular examinations is right for you. This is a good chance for you to check in with your provider about disease prevention and staying healthy. In between checkups, there are plenty of things you can do on your own. Experts have done a lot of research about which lifestyle changes and preventive measures are most likely to keep you healthy. Ask your health care provider for more information. Weight and diet Eat a healthy diet  Be sure to include plenty of vegetables, fruits, low-fat dairy products, and lean protein.  Do not eat a lot of foods high in solid fats, added sugars, or salt.  Get regular exercise. This is one of the most important things you can do for your health. ? Most adults should exercise for at least 150 minutes each week. The exercise should increase your heart rate and make you sweat (moderate-intensity exercise). ? Most adults should also do strengthening exercises at least twice a week. This is in addition to the moderate-intensity exercise.  Maintain a healthy weight  Body mass index (BMI) is a measurement that can be used to identify possible weight problems. It estimates body fat based on height and weight. Your health care provider can help determine your BMI and help you achieve or maintain a healthy weight.  For females 43 years of age and older: ? A BMI below 18.5 is considered underweight. ? A BMI of 18.5 to 24.9 is normal. ? A BMI of 25 to 29.9 is considered overweight. ? A BMI of 30 and above is considered obese.  Watch levels of cholesterol and blood lipids  You should start having your blood tested for lipids and  cholesterol at 29 years of age, then have this test every 5 years.  You may need to have your cholesterol levels checked more often if: ? Your lipid or cholesterol levels are high. ? You are older than 29 years of age. ? You are at high risk for heart disease.  Cancer screening Lung Cancer  Lung cancer screening is recommended for adults 40-74 years old who are at high risk for lung cancer because of a history of smoking.  A yearly low-dose CT scan of the lungs is recommended for people who: ? Currently smoke. ? Have quit within the past 15 years. ? Have at least a 30-pack-year history of smoking. A pack year is smoking an average of one pack of cigarettes a day for 1 year.  Yearly screening should continue until it has been 15 years since you quit.  Yearly screening should stop if you develop a health problem that would prevent you from having lung cancer treatment.  Breast Cancer  Practice breast self-awareness. This means understanding how your breasts normally appear and feel.  It also means doing regular breast self-exams. Let your health care provider know about any changes, no matter how small.  If you are in your 20s or 30s, you should have a clinical breast exam (CBE) by a health care provider every 1-3 years as part of a regular health exam.  If you are 32 or older, have a CBE every year. Also consider having a breast X-ray (mammogram) every year.  If you  have a family history of breast cancer, talk to your health care provider about genetic screening.  If you are at high risk for breast cancer, talk to your health care provider about having an MRI and a mammogram every year.  Breast cancer gene (BRCA) assessment is recommended for women who have family members with BRCA-related cancers. BRCA-related cancers include: ? Breast. ? Ovarian. ? Tubal. ? Peritoneal cancers.  Results of the assessment will determine the need for genetic counseling and BRCA1 and BRCA2  testing.  Cervical Cancer Your health care provider may recommend that you be screened regularly for cancer of the pelvic organs (ovaries, uterus, and vagina). This screening involves a pelvic examination, including checking for microscopic changes to the surface of your cervix (Pap test). You may be encouraged to have this screening done every 3 years, beginning at age 43.  For women ages 21-65, health care providers may recommend pelvic exams and Pap testing every 3 years, or they may recommend the Pap and pelvic exam, combined with testing for human papilloma virus (HPV), every 5 years. Some types of HPV increase your risk of cervical cancer. Testing for HPV may also be done on women of any age with unclear Pap test results.  Other health care providers may not recommend any screening for nonpregnant women who are considered low risk for pelvic cancer and who do not have symptoms. Ask your health care provider if a screening pelvic exam is right for you.  If you have had past treatment for cervical cancer or a condition that could lead to cancer, you need Pap tests and screening for cancer for at least 20 years after your treatment. If Pap tests have been discontinued, your risk factors (such as having a new sexual partner) need to be reassessed to determine if screening should resume. Some women have medical problems that increase the chance of getting cervical cancer. In these cases, your health care provider may recommend more frequent screening and Pap tests.  Colorectal Cancer  This type of cancer can be detected and often prevented.  Routine colorectal cancer screening usually begins at 29 years of age and continues through 29 years of age.  Your health care provider may recommend screening at an earlier age if you have risk factors for colon cancer.  Your health care provider may also recommend using home test kits to check for hidden blood in the stool.  A small camera at the end of a  tube can be used to examine your colon directly (sigmoidoscopy or colonoscopy). This is done to check for the earliest forms of colorectal cancer.  Routine screening usually begins at age 48.  Direct examination of the colon should be repeated every 5-10 years through 29 years of age. However, you may need to be screened more often if early forms of precancerous polyps or small growths are found.  Skin Cancer  Check your skin from head to toe regularly.  Tell your health care provider about any new moles or changes in moles, especially if there is a change in a mole's shape or color.  Also tell your health care provider if you have a mole that is larger than the size of a pencil eraser.  Always use sunscreen. Apply sunscreen liberally and repeatedly throughout the day.  Protect yourself by wearing long sleeves, pants, a wide-brimmed hat, and sunglasses whenever you are outside.  Heart disease, diabetes, and high blood pressure  High blood pressure causes heart disease and increases the  risk of stroke. High blood pressure is more likely to develop in: ? People who have blood pressure in the high end of the normal range (130-139/85-89 mm Hg). ? People who are overweight or obese. ? People who are African American.  If you are 82-64 years of age, have your blood pressure checked every 3-5 years. If you are 73 years of age or older, have your blood pressure checked every year. You should have your blood pressure measured twice-once when you are at a hospital or clinic, and once when you are not at a hospital or clinic. Record the average of the two measurements. To check your blood pressure when you are not at a hospital or clinic, you can use: ? An automated blood pressure machine at a pharmacy. ? A home blood pressure monitor.  If you are between 37 years and 7 years old, ask your health care provider if you should take aspirin to prevent strokes.  Have regular diabetes screenings. This  involves taking a blood sample to check your fasting blood sugar level. ? If you are at a normal weight and have a low risk for diabetes, have this test once every three years after 29 years of age. ? If you are overweight and have a high risk for diabetes, consider being tested at a younger age or more often. Preventing infection Hepatitis B  If you have a higher risk for hepatitis B, you should be screened for this virus. You are considered at high risk for hepatitis B if: ? You were born in a country where hepatitis B is common. Ask your health care provider which countries are considered high risk. ? Your parents were born in a high-risk country, and you have not been immunized against hepatitis B (hepatitis B vaccine). ? You have HIV or AIDS. ? You use needles to inject street drugs. ? You live with someone who has hepatitis B. ? You have had sex with someone who has hepatitis B. ? You get hemodialysis treatment. ? You take certain medicines for conditions, including cancer, organ transplantation, and autoimmune conditions.  Hepatitis C  Blood testing is recommended for: ? Everyone born from [birth year] through 1967. ? Anyone with known risk factors for hepatitis C.  Sexually transmitted infections (STIs)  You should be screened for sexually transmitted infections (STIs) including gonorrhea and chlamydia if: ? You are sexually active and are younger than 29 years of age. ? You are older than 29 years of age and your health care provider tells you that you are at risk for this type of infection. ? Your sexual activity has changed since you were last screened and you are at an increased risk for chlamydia or gonorrhea. Ask your health care provider if you are at risk.  If you do not have HIV, but are at risk, it may be recommended that you take a prescription medicine daily to prevent HIV infection. This is called pre-exposure prophylaxis (PrEP). You are considered at risk if: ? You are  sexually active and do not regularly use condoms or know the HIV status of your partner(s). ? You take drugs by injection. ? You are sexually active with a partner who has HIV.  Talk with your health care provider about whether you are at high risk of being infected with HIV. If you choose to begin PrEP, you should first be tested for HIV. You should then be tested every 3 months for as long as you are taking PrEP. Pregnancy  If you are premenopausal and you may become pregnant, ask your health care provider about preconception counseling.  If you may become pregnant, take 400 to 800 micrograms (mcg) of folic acid every day.  If you want to prevent pregnancy, talk to your health care provider about birth control (contraception). Osteoporosis and menopause  Osteoporosis is a disease in which the bones lose minerals and strength with aging. This can result in serious bone fractures. Your risk for osteoporosis can be identified using a bone density scan.  If you are 8 years of age or older, or if you are at risk for osteoporosis and fractures, ask your health care provider if you should be screened.  Ask your health care provider whether you should take a calcium or vitamin D supplement to lower your risk for osteoporosis.  Menopause may have certain physical symptoms and risks.  Hormone replacement therapy may reduce some of these symptoms and risks. Talk to your health care provider about whether hormone replacement therapy is right for you. Follow these instructions at home:  Schedule regular health, dental, and eye exams.  Stay current with your immunizations.  Do not use any tobacco products including cigarettes, chewing tobacco, or electronic cigarettes.  If you are pregnant, do not drink alcohol.  If you are breastfeeding, limit how much and how often you drink alcohol.  Limit alcohol intake to no more than 1 drink per day for nonpregnant women. One drink equals 12 ounces of  beer, 5 ounces of wine, or 1 ounces of hard liquor.  Do not use street drugs.  Do not share needles.  Ask your health care provider for help if you need support or information about quitting drugs.  Tell your health care provider if you often feel depressed.  Tell your health care provider if you have ever been abused or do not feel safe at home. This information is not intended to replace advice given to you by your health care provider. Make sure you discuss any questions you have with your health care provider. Document Released: 06/05/2011 Document Revised: 04/27/2016 Document Reviewed: 08/24/2015 Elsevier Interactive Patient Education  [employer].

## 2018-02-20 NOTE — Progress Notes (Signed)
GYNECOLOGY ANNUAL PHYSICAL EXAM PROGRESS NOTE  Subjective:    Hannah Larson is a 27 y.o. G54P2002 female who presents for an annual exam. The patient has no complaints today. The patient is sexually active. The patient wears seatbelts: yes. The patient participates in regular exercise: yes (10-15 min, 5 days per week). Has the patient ever been transfused or tattooed?: no. The patient reports that there is not domestic violence in her life.   Patient desires to discuss contraception options today. She is thinking about resuming OCPs, but wonders if there is a different she can try as she discontinued her OCPs in the past due to side effects (nausea).    Gynecologic History Menarche age: 22 Patient's last menstrual period was 01/30/2018. Contraception: none.  Has used OCPs in the past, but noted nausea with medication so discontinued. History of STI's: Denies Last Pap: 2016. Results were: normal.  Denies h/o abnormal pap smears.    Obstetric History   G2   P2   T2   P0   A0   L2    SAB0   TAB0   Ectopic0   Multiple0   Live Births2     # Outcome Date GA Lbr Len/2nd Weight Sex Delivery Anes PTL Lv  2 Term 07/02/17 [redacted]w[redacted]d 04:31 / 00:34 9 lb 14.4 oz (4.49 kg) M Vag-Spont Local  LIV     Name: Penton,BOY Tiarrah     Apgar1:  9                Apgar5: 9  1 Term 2016   8 lb 9.6 oz (3.901 kg) M Vag-Spont   LIV      Past Medical History:  Diagnosis Date  . Known health problems: none     Past Surgical History:  Procedure Laterality Date  . NO PAST SURGERIES      Family History  Problem Relation Age of Onset  . Diabetes Sister   . Breast cancer Neg Hx   . Migraines Neg Hx   . Colon cancer Neg Hx   . Heart disease Neg Hx     Social History   Socioeconomic History  . Marital status: Married    Spouse name: Not on file  . Number of children: Not on file  . Years of education: Not on file  . Highest education level: Not on file  Social Needs  . Financial resource strain: Not  on file  . Food insecurity - worry: Not on file  . Food insecurity - inability: Not on file  . Transportation needs - medical: Not on file  . Transportation needs - non-medical: Not on file  Occupational History  . Not on file  Tobacco Use  . Smoking status: Never Smoker  . Smokeless tobacco: Never Used  Substance and Sexual Activity  . Alcohol use: No  . Drug use: No  . Sexual activity: Yes    Birth control/protection: None, Condom  Other Topics Concern  . Not on file  Social History Narrative  . Not on file    No current outpatient medications on file prior to visit.   No current facility-administered medications on file prior to visit.     No Known Allergies   Review of Systems Constitutional: negative for chills, fatigue, fevers and sweats Eyes: negative for irritation, redness and visual disturbance Ears, nose, mouth, throat, and face: negative for hearing loss, nasal congestion, snoring and tinnitus Respiratory: negative for asthma, cough, sputum Cardiovascular: negative for chest  pain, dyspnea, exertional chest pressure/discomfort, irregular heart beat, palpitations and syncope Gastrointestinal: negative for abdominal pain, change in bowel habits, nausea and vomiting Genitourinary: negative for abnormal menstrual periods, genital lesions, sexual problems, dysuria and urinary incontinence.  Positive for increased discharge (but no odor or itching/burning.  Notes that has been ongoing for years).  Integument/breast: negative for breast lump, breast tenderness and nipple discharge Hematologic/lymphatic: negative for bleeding and easy bruising Musculoskeletal:negative for back pain and muscle weakness Neurological: negative for dizziness, headaches, vertigo and weakness Endocrine: negative for diabetic symptoms including polydipsia, polyuria and skin dryness Allergic/Immunologic: negative for hay fever and urticaria        Objective:  Blood pressure 107/72, pulse 84,  height 5\' 2"  (1.575 m), weight 150 lb 4.8 oz (68.2 kg), last menstrual period 01/30/2018, not currently breastfeeding. Body mass index is 27.49 kg/m.  General Appearance:    Alert, cooperative, no distress, appears stated age  Head:    Normocephalic, without obvious abnormality, atraumatic  Eyes:    PERRL, conjunctiva/corneas clear, EOM's intact, both eyes  Ears:    Normal external ear canals, both ears  Nose:   Nares normal, septum midline, mucosa normal, no drainage or sinus tenderness  Throat:   Lips, mucosa, and tongue normal; teeth and gums normal  Neck:   Supple, symmetrical, trachea midline, no adenopathy; thyroid: no enlargement/tenderness/nodules; no carotid bruit or JVD  Back:     Symmetric, no curvature, ROM normal, no CVA tenderness  Lungs:     Clear to auscultation bilaterally, respirations unlabored  Chest Wall:    No tenderness or deformity   Heart:    Regular rate and rhythm, S1 and S2 normal, no murmur, rub or gallop  Breast Exam:    No tenderness, masses, or nipple abnormality  Abdomen:     Soft, non-tender, bowel sounds active all four quadrants, no masses, no organomegaly.    Genitalia:    Pelvic:external genitalia normal, vagina without lesions, or tenderness.  Small amount of thin white discharge, no odor.  Rectovaginal septum  normal. Cervix normal in appearance, no cervical motion tenderness, no adnexal masses or tenderness.  Uterus normal size, shape, mobile, regular contours, nontender.  Rectal:    Normal external sphincter.  No hemorrhoids appreciated. Internal exam not done.   Extremities:   Extremities normal, atraumatic, no cyanosis or edema  Pulses:   2+ and symmetric all extremities  Skin:   Skin color, texture, turgor normal, no rashes or lesions  Lymph nodes:   Cervical, supraclavicular, and axillary nodes normal  Neurologic:   CNII-XII intact, normal strength, sensation and reflexes throughout   .  Labs:  Lab Results  Component Value Date   WBC 9.4  07/03/2017   HGB 11.4 (L) 07/03/2017   HCT 33.7 (L) 07/03/2017   MCV 87.6 07/03/2017   PLT 90 (L) 07/03/2017    No results found for: CREATININE, BUN, NA, K, CL, CO2  No results found for: ALT, AST, GGT, ALKPHOS, BILITOT  No results found for: TSH   Assessment:    Healthy female exam.   Contraception management Overweight H/o gestational thrombocytopenia Leukorrhea   Plan:     Blood tests: Basic metabolic panel and CBC with diff. Breast self exam technique reviewed and patient encouraged to perform self-exam monthly. Contraception: OCP (estrogen/progesterone).  Will prescribe Lo-Loestrin due to side effects with previous OCPs.  Also advised on taking pills at night. Advised on Sunday start after next menses.  Discussed healthy lifestyle modifications. Pap smear ordered  today.   Discussed that viagnal discharge appeared normal, non-infectious. Discussed leukorrhea as normal physiologic occurrence.   Will recheck platelets due to h/o thrombocytopenia.  Follow up in 1 year.    Hildred Laser, MD [facility]

## 2018-02-21 LAB — CBC
Hematocrit: 41.6 % (ref 34.0–46.6)
Hemoglobin: 13.6 g/dL (ref 11.1–15.9)
MCH: 29.1 pg (ref 26.6–33.0)
MCHC: 32.7 g/dL (ref 31.5–35.7)
MCV: 89 fL (ref 79–97)
Platelets: 246 10*3/uL (ref 150–379)
RBC: 4.67 x10E6/uL (ref 3.77–5.28)
RDW: 13 % (ref 12.3–15.4)
WBC: 5.5 10*3/uL (ref 3.4–10.8)

## 2018-02-21 LAB — COMPREHENSIVE METABOLIC PANEL
ALT: 17 IU/L (ref 0–32)
AST: 19 IU/L (ref 0–40)
Albumin/Globulin Ratio: 2 (ref 1.2–2.2)
Albumin: 4.8 g/dL (ref 3.5–5.5)
Alkaline Phosphatase: 72 IU/L (ref 39–117)
BUN/Creatinine Ratio: 24 — ABNORMAL HIGH (ref 9–23)
BUN: 14 mg/dL (ref 6–20)
Bilirubin Total: 0.5 mg/dL (ref 0.0–1.2)
CO2: 26 mmol/L (ref 20–29)
Calcium: 9.8 mg/dL (ref 8.7–10.2)
Chloride: 99 mmol/L (ref 96–106)
Creatinine, Ser: 0.59 mg/dL (ref 0.57–1.00)
GFR calc Af Amer: 144 mL/min/{1.73_m2} (ref >59)
GFR calc non Af Amer: 125 mL/min/{1.73_m2} (ref >59)
Globulin, Total: 2.4 g/dL (ref 1.5–4.5)
Glucose: 80 mg/dL (ref 65–99)
Potassium: 4.4 mmol/L (ref 3.5–5.2)
Sodium: 139 mmol/L (ref 134–144)
Total Protein: 7.2 g/dL (ref 6.0–8.5)

## 2018-02-23 LAB — PAP IG, CT-NG, RFX HPV ASCU
Chlamydia, Nuc. Acid Amp: NEGATIVE
Gonococcus by Nucleic Acid Amp: NEGATIVE
PAP Smear Comment: 0

## 2018-02-28 ENCOUNTER — Telehealth: Payer: Self-pay | Admitting: Obstetrics and Gynecology

## 2018-02-28 NOTE — Telephone Encounter (Signed)
Pt was contacted [facility]icemail [facility] and if she had any questions or concerns that she could call back and speak to the nurse.

## 2018-02-28 NOTE — Telephone Encounter (Signed)
The patient called and state that she was prescribed a B.C Norethindrone-Ethinyl Estradiol-Fe Biphas (LO LOESTRIN FE) 1 MG-10 MCG / 10 MCG tablet, and after her insurance the patient still has to pay $100.00 out of pocket. The patient would like for Dr. Valentino Saxonherry to send in another prescription if possible.

## 2018-02-28 NOTE — Telephone Encounter (Signed)
Pt called back and stated that the pharmacy gave her false information and [facility] was able [facility].

## 2018-05-03 DIAGNOSIS — R002 Palpitations: Secondary | ICD-10-CM | POA: Insufficient documentation

## 2018-10-02 ENCOUNTER — Ambulatory Visit (INDEPENDENT_AMBULATORY_CARE_PROVIDER_SITE_OTHER): Payer: BLUE CROSS/BLUE SHIELD | Admitting: Obstetrics and Gynecology

## 2018-10-02 ENCOUNTER — Encounter: Payer: Self-pay | Admitting: Obstetrics and Gynecology

## 2018-10-02 VITALS — BP 93/54 | HR 63 | Ht 62.0 in | Wt 156.4 lb

## 2018-10-02 DIAGNOSIS — Z975 Presence of (intrauterine) contraceptive device: Secondary | ICD-10-CM | POA: Insufficient documentation

## 2018-10-02 DIAGNOSIS — Z30430 Encounter for insertion of intrauterine contraceptive device: Secondary | ICD-10-CM | POA: Diagnosis not present

## 2018-10-02 NOTE — Progress Notes (Signed)
     GYNECOLOGY OFFICE PROCEDURE NOTE  Hannah Larson is a 27 y.o. [id number] here for Mirena IUD insertion. No GYN concerns.  Last pap smear was on 02/20/2018 and was normal.  IUD Insertion Procedure Note Patient identified, informed consent performed, consent signed.   Discussed risks of irregular bleeding, cramping, infection, malpositioning or misplacement of the IUD outside the uterus which may require further procedure such as laparoscopy. Time out was performed.  Urine pregnancy test negative.  Speculum placed in the vagina.  Cervix visualized.  Cleaned with Betadine x 2.  Grasped anteriorly with a single tooth tenaculum.  Uterus sounded to 8 cm.  Mirena IUD placed per manufacturer's recommendations.  Strings trimmed to 3 cm. Tenaculum was removed, good hemostasis noted.  Patient tolerated procedure well.   Patient was given post-procedure instructions.  She was advised to have backup contraception for one week.  Patient was also asked to check IUD strings periodically and follow up in 4 weeks for IUD check.   Exp: 12/2020 Lot #: Marco Collie, MD [facility]

## 2018-10-02 NOTE — Patient Instructions (Signed)

## 2018-10-02 NOTE — Progress Notes (Signed)
Pt is present today for birth control. Pt stated that she would like to have the Mirena. UPT done results were neg. Pt stated that she is doing well no complaints.

## 2018-11-06 ENCOUNTER — Ambulatory Visit (INDEPENDENT_AMBULATORY_CARE_PROVIDER_SITE_OTHER): Payer: BLUE CROSS/BLUE SHIELD | Admitting: Obstetrics and Gynecology

## 2018-11-06 ENCOUNTER — Encounter: Payer: Self-pay | Admitting: Obstetrics and Gynecology

## 2018-11-06 VITALS — BP 112/75 | HR 81 | Ht 62.0 in | Wt 156.5 lb

## 2018-11-06 DIAGNOSIS — Z30431 Encounter for routine checking of intrauterine contraceptive device: Secondary | ICD-10-CM | POA: Diagnosis not present

## 2018-11-06 NOTE — Progress Notes (Signed)
Pt is present today for IUD string check. Pt stated that she had her cycle the entire month of November. Pt stated that she has tried to check her strings for placement but was unable to feel them. No other problems or concerns.

## 2018-11-06 NOTE — Progress Notes (Signed)
    GYNECOLOGY OFFICE ENCOUNTER NOTE  History:  27 y.o. [id number] here today for today for IUD string check; Mirena  IUD was placed  10/02/2018. She reports that she has been bleeding since the insertion of the IUD, initially heavy, but now has tapered off to light bleeding/spotting. She also reports that initially her partner felt the strings, but no longer feels them. States that she tried to reach to feel her strings but could not. Last pap smear on 02/2018 was normal, negative HRHPV.  The following portions of the patient's history were reviewed and updated as appropriate: allergies, current medications, past family history, past medical history, past social history, past surgical history and problem list.   Review of Systems:  Pertinent items are noted in HPI.   Objective:  Physical Exam Blood pressure 112/75, pulse 81, height 5\' 2"  (1.575 m), weight 156 lb 8 oz (71 kg), last menstrual period 10/06/2018, not currently breastfeeding. CONSTITUTIONAL: Well-developed, well-nourished female in no acute distress.  ABDOMEN: Soft, no distention noted.   PELVIC: Normal appearing external genitalia; normal appearing vaginal mucosa and cervix.  IUD strings visualized, about 3 cm in length outside cervix.  EXTREMITIES:  extremities normal, atraumatic, no cyanosis or edema NEUROLOGIC:  Grossly normal   Assessment & Plan:  - Patient to keep IUD in place for up to five years; can come in for removal if she desires pregnancy earlier or for or concerning side effects.  Discussed that per manufacturer pamphlet, to allow 3 months for any abnormal bleeding to prior to consideration of IUD removal.  Patient notes understanding, states that she is ok with the bleeding as it has slowed.  - Return to clinic for any scheduled appointments or for any gynecologic concerns as needed.    Hildred Laserherry, Daaiel Starlin, MD [facility]

## 2018-11-07 ENCOUNTER — Encounter: Payer: Self-pay | Admitting: Obstetrics and Gynecology

## 2019-11-10 DIAGNOSIS — M546 Pain in thoracic spine: Secondary | ICD-10-CM | POA: Diagnosis not present

## 2019-11-10 DIAGNOSIS — M9902 Segmental and somatic dysfunction of thoracic region: Secondary | ICD-10-CM | POA: Diagnosis not present

## 2019-11-10 DIAGNOSIS — M531 Cervicobrachial syndrome: Secondary | ICD-10-CM | POA: Diagnosis not present

## 2019-11-10 DIAGNOSIS — M9901 Segmental and somatic dysfunction of cervical region: Secondary | ICD-10-CM | POA: Diagnosis not present

## 2019-11-12 DIAGNOSIS — M531 Cervicobrachial syndrome: Secondary | ICD-10-CM | POA: Diagnosis not present

## 2019-11-12 DIAGNOSIS — M9902 Segmental and somatic dysfunction of thoracic region: Secondary | ICD-10-CM | POA: Diagnosis not present

## 2019-11-12 DIAGNOSIS — M546 Pain in thoracic spine: Secondary | ICD-10-CM | POA: Diagnosis not present

## 2019-11-12 DIAGNOSIS — M9901 Segmental and somatic dysfunction of cervical region: Secondary | ICD-10-CM | POA: Diagnosis not present

## 2019-11-13 DIAGNOSIS — M531 Cervicobrachial syndrome: Secondary | ICD-10-CM | POA: Diagnosis not present

## 2019-11-13 DIAGNOSIS — M546 Pain in thoracic spine: Secondary | ICD-10-CM | POA: Diagnosis not present

## 2019-11-13 DIAGNOSIS — M9902 Segmental and somatic dysfunction of thoracic region: Secondary | ICD-10-CM | POA: Diagnosis not present

## 2019-11-13 DIAGNOSIS — M9901 Segmental and somatic dysfunction of cervical region: Secondary | ICD-10-CM | POA: Diagnosis not present

## 2019-12-08 DIAGNOSIS — M531 Cervicobrachial syndrome: Secondary | ICD-10-CM | POA: Diagnosis not present

## 2019-12-08 DIAGNOSIS — M546 Pain in thoracic spine: Secondary | ICD-10-CM | POA: Diagnosis not present

## 2019-12-08 DIAGNOSIS — M9902 Segmental and somatic dysfunction of thoracic region: Secondary | ICD-10-CM | POA: Diagnosis not present

## 2019-12-08 DIAGNOSIS — M9901 Segmental and somatic dysfunction of cervical region: Secondary | ICD-10-CM | POA: Diagnosis not present

## 2019-12-15 DIAGNOSIS — M546 Pain in thoracic spine: Secondary | ICD-10-CM | POA: Diagnosis not present

## 2019-12-15 DIAGNOSIS — M531 Cervicobrachial syndrome: Secondary | ICD-10-CM | POA: Diagnosis not present

## 2019-12-15 DIAGNOSIS — M9901 Segmental and somatic dysfunction of cervical region: Secondary | ICD-10-CM | POA: Diagnosis not present

## 2019-12-15 DIAGNOSIS — M9902 Segmental and somatic dysfunction of thoracic region: Secondary | ICD-10-CM | POA: Diagnosis not present

## 2019-12-29 DIAGNOSIS — M9902 Segmental and somatic dysfunction of thoracic region: Secondary | ICD-10-CM | POA: Diagnosis not present

## 2019-12-29 DIAGNOSIS — M9901 Segmental and somatic dysfunction of cervical region: Secondary | ICD-10-CM | POA: Diagnosis not present

## 2019-12-29 DIAGNOSIS — M531 Cervicobrachial syndrome: Secondary | ICD-10-CM | POA: Diagnosis not present

## 2019-12-29 DIAGNOSIS — M546 Pain in thoracic spine: Secondary | ICD-10-CM | POA: Diagnosis not present

## 2020-01-19 DIAGNOSIS — M531 Cervicobrachial syndrome: Secondary | ICD-10-CM | POA: Diagnosis not present

## 2020-01-19 DIAGNOSIS — M546 Pain in thoracic spine: Secondary | ICD-10-CM | POA: Diagnosis not present

## 2020-01-19 DIAGNOSIS — M9901 Segmental and somatic dysfunction of cervical region: Secondary | ICD-10-CM | POA: Diagnosis not present

## 2020-01-19 DIAGNOSIS — M9902 Segmental and somatic dysfunction of thoracic region: Secondary | ICD-10-CM | POA: Diagnosis not present

## 2020-03-01 DIAGNOSIS — M546 Pain in thoracic spine: Secondary | ICD-10-CM | POA: Diagnosis not present

## 2020-03-01 DIAGNOSIS — M9901 Segmental and somatic dysfunction of cervical region: Secondary | ICD-10-CM | POA: Diagnosis not present

## 2020-03-01 DIAGNOSIS — M9902 Segmental and somatic dysfunction of thoracic region: Secondary | ICD-10-CM | POA: Diagnosis not present

## 2020-03-01 DIAGNOSIS — M531 Cervicobrachial syndrome: Secondary | ICD-10-CM | POA: Diagnosis not present

## 2020-04-19 DIAGNOSIS — M9901 Segmental and somatic dysfunction of cervical region: Secondary | ICD-10-CM | POA: Diagnosis not present

## 2020-04-19 DIAGNOSIS — M531 Cervicobrachial syndrome: Secondary | ICD-10-CM | POA: Diagnosis not present

## 2020-04-19 DIAGNOSIS — M9902 Segmental and somatic dysfunction of thoracic region: Secondary | ICD-10-CM | POA: Diagnosis not present

## 2020-04-19 DIAGNOSIS — M546 Pain in thoracic spine: Secondary | ICD-10-CM | POA: Diagnosis not present

## 2020-05-31 DIAGNOSIS — M9901 Segmental and somatic dysfunction of cervical region: Secondary | ICD-10-CM | POA: Diagnosis not present

## 2020-05-31 DIAGNOSIS — M9902 Segmental and somatic dysfunction of thoracic region: Secondary | ICD-10-CM | POA: Diagnosis not present

## 2020-05-31 DIAGNOSIS — M531 Cervicobrachial syndrome: Secondary | ICD-10-CM | POA: Diagnosis not present

## 2020-05-31 DIAGNOSIS — M546 Pain in thoracic spine: Secondary | ICD-10-CM | POA: Diagnosis not present

## 2020-07-15 DIAGNOSIS — M9902 Segmental and somatic dysfunction of thoracic region: Secondary | ICD-10-CM | POA: Diagnosis not present

## 2020-07-15 DIAGNOSIS — M546 Pain in thoracic spine: Secondary | ICD-10-CM | POA: Diagnosis not present

## 2020-07-15 DIAGNOSIS — M9901 Segmental and somatic dysfunction of cervical region: Secondary | ICD-10-CM | POA: Diagnosis not present

## 2020-07-15 DIAGNOSIS — M531 Cervicobrachial syndrome: Secondary | ICD-10-CM | POA: Diagnosis not present

## 2020-07-21 DIAGNOSIS — B078 Other viral warts: Secondary | ICD-10-CM | POA: Diagnosis not present

## 2020-08-06 ENCOUNTER — Encounter: Payer: BLUE CROSS/BLUE SHIELD | Admitting: Obstetrics and Gynecology

## 2020-08-23 DIAGNOSIS — B078 Other viral warts: Secondary | ICD-10-CM | POA: Diagnosis not present

## 2020-09-01 ENCOUNTER — Telehealth: Payer: Self-pay

## 2020-09-01 NOTE — Telephone Encounter (Signed)
Pt called in and stated that she has had to move her appt 2 times because of the provider the pt was requesting to be seen sooner that 11/24. I told the pt I will send a message to the nurse and to please allow 24 to 48 hours for a reply. The pt verbally understood. Please advise

## 2020-09-20 DIAGNOSIS — M531 Cervicobrachial syndrome: Secondary | ICD-10-CM | POA: Diagnosis not present

## 2020-09-20 DIAGNOSIS — M9902 Segmental and somatic dysfunction of thoracic region: Secondary | ICD-10-CM | POA: Diagnosis not present

## 2020-09-20 DIAGNOSIS — M546 Pain in thoracic spine: Secondary | ICD-10-CM | POA: Diagnosis not present

## 2020-09-20 DIAGNOSIS — M9901 Segmental and somatic dysfunction of cervical region: Secondary | ICD-10-CM | POA: Diagnosis not present

## 2020-09-22 DIAGNOSIS — B078 Other viral warts: Secondary | ICD-10-CM | POA: Diagnosis not present

## 2020-09-28 NOTE — Telephone Encounter (Signed)
The earliest I could move her up is the Nov 17th at 2:30. Otherwise we will keep her on a wait list and see if any cancellations occur and try to move her up more.

## 2020-09-28 NOTE — Telephone Encounter (Signed)
Will you please see if you can get this pt in earlier than her scheduled appointment. Thanks [employer]

## 2020-09-30 NOTE — Telephone Encounter (Signed)
Called patient to inform her that we could move her to November 17th at 2:30, no answer. LVM for patient to call the office if she was interested in making this appointment change.

## 2020-10-08 ENCOUNTER — Encounter: Payer: BLUE CROSS/BLUE SHIELD | Admitting: Obstetrics and Gynecology

## 2020-10-13 DIAGNOSIS — B078 Other viral warts: Secondary | ICD-10-CM | POA: Diagnosis not present

## 2020-10-20 ENCOUNTER — Ambulatory Visit (INDEPENDENT_AMBULATORY_CARE_PROVIDER_SITE_OTHER): Payer: BC Managed Care – PPO | Admitting: Obstetrics and Gynecology

## 2020-10-20 ENCOUNTER — Encounter: Payer: Self-pay | Admitting: Obstetrics and Gynecology

## 2020-10-20 ENCOUNTER — Other Ambulatory Visit: Payer: Self-pay

## 2020-10-20 VITALS — BP 116/73 | HR 80 | Ht 62.0 in | Wt 157.8 lb

## 2020-10-20 DIAGNOSIS — E663 Overweight: Secondary | ICD-10-CM | POA: Diagnosis not present

## 2020-10-20 DIAGNOSIS — Z01419 Encounter for gynecological examination (general) (routine) without abnormal findings: Secondary | ICD-10-CM

## 2020-10-20 DIAGNOSIS — Z87898 Personal history of other specified conditions: Secondary | ICD-10-CM | POA: Diagnosis not present

## 2020-10-20 NOTE — Progress Notes (Signed)
GYNECOLOGY ANNUAL PHYSICAL EXAM PROGRESS NOTE  Subjective:    Hannah Larson is a 29 y.o. G75P2002 female who presents for an annual exam.The patient is sexually active. The patient wears seatbelts: yes. The patient participates in regular exercise: yes (10-15 min, 5 days per week). Has the patient ever been transfused or tattooed?: no.   The patient has no complaints today:  1. Patient desires to discuss breast reduction. She complains of sweating and rashes under her breasts.  Also has been noting some back pain.      Menstrual History: Menarche age: 11 Patient's last menstrual period was 10/13/2020. Period Duration (Days): 1 week-1/2 weeks Period Pattern: Regular Menstrual Flow: Light, Moderate Menstrual Control: Maxi pad Menstrual Control Change Freq (Hours): 2-3 Dysmenorrhea: (!) Moderate Dysmenorrhea Symptoms: Cramping    Gynecologic History: Contraception: Mirena IUD (inserted 2018).  The pregnancy intention screening data noted in EPIC was reviewed. Potential methods of contraception were not discussed. The patient elected to continuewith IUD or IUS.  History of STI's: Denies Last Pap: 02/20/2018. Results were: normal.  Denies h/o abnormal pap smears.      OB History  Gravida Para Term Preterm AB Living  2 2 2  0 0 2  SAB TAB Ectopic Multiple Live Births  0 0 0 0 2    # Outcome Date GA Lbr Len/2nd Weight Sex Delivery Anes PTL Lv  2 Term 07/02/17 [redacted]w[redacted]d 04:31 / 00:34 9 lb 14.4 oz (4.49 kg) M Vag-Spont Local  LIV     Name: Deckman,BOY Ardell     Apgar1: 9  Apgar5: 9  1 Term 2016   8 lb 9.6 oz (3.901 kg) M Vag-Spont   LIV    Past Medical History:  Diagnosis Date  . Known health problems: none     Past Surgical History:  Procedure Laterality Date  . NO PAST SURGERIES      Family History  Problem Relation Age of Onset  . Diabetes Sister   . Healthy Mother   . Healthy Father   . Breast cancer Neg Hx   . Migraines Neg Hx   . Colon cancer Neg Hx   . Heart  disease Neg Hx     Social History   Socioeconomic History  . Marital status: Married    Spouse name: Not on file  . Number of children: Not on file  . Years of education: Not on file  . Highest education level: Not on file  Occupational History  . Not on file  Tobacco Use  . Smoking status: Never Smoker  . Smokeless tobacco: Never Used  Vaping Use  . Vaping Use: Never used  Substance and Sexual Activity  . Alcohol use: No  . Drug use: No  . Sexual activity: Yes    Birth control/protection: I.U.D.  Other Topics Concern  . Not on file  Social History Narrative  . Not on file   Social Determinants of Health   Financial Resource Strain:   . Difficulty of Paying Living Expenses: Not on file  Food Insecurity:   . Worried About 2017 in the Last Year: Not on file  . Ran Out of Food in the Last Year: Not on file  Transportation Needs:   . Lack of Transportation (Medical): Not on file  . Lack of Transportation (Non-Medical): Not on file  Physical Activity:   . Days of Exercise per Week: Not on file  . Minutes of Exercise per Session: Not on file  Stress:   . Feeling of Stress : Not on file  Social Connections:   . Frequency of Communication with Friends and Family: Not on file  . Frequency of Social Gatherings with Friends and Family: Not on file  . Attends Religious Services: Not on file  . Active Member of Clubs or Organizations: Not on file  . Attends Banker Meetings: Not on file  . Marital Status: Not on file  Intimate Partner Violence:   . Fear of Current or Ex-Partner: Not on file  . Emotionally Abused: Not on file  . Physically Abused: Not on file  . Sexually Abused: Not on file    Current Outpatient Medications on File Prior to Visit  Medication Sig Dispense Refill  . levonorgestrel (MIRENA) 20 MCG/24HR IUD 1 each by Intrauterine route once.     No current facility-administered medications on file prior to visit.    No Known  Allergies   Review of Systems Constitutional: negative for chills, fatigue, fevers and sweats Eyes: negative for irritation, redness and visual disturbance Ears, nose, mouth, throat, and face: negative for hearing loss, nasal congestion, snoring and tinnitus Respiratory: negative for asthma, cough, sputum.  Cardiovascular: negative for chest pain, dyspnea, exertional chest pressure/discomfort, irregular heart beat, palpitations and syncope Gastrointestinal: negative for abdominal pain, change in bowel habits, nausea and vomiting Genitourinary: negative for abnormal menstrual periods, genital lesions, sexual problems, dysuria and urinary incontinence.  Integument/breast: negative for breast lump, breast tenderness and nipple discharge. Positive for sweating and rashes under breasts.  Hematologic/lymphatic: negative for bleeding and easy bruising Musculoskeletal:negative for back pain and muscle weakness Neurological: negative for dizziness, headaches, vertigo and weakness Endocrine: negative for diabetic symptoms including polydipsia, polyuria and skin dryness Allergic/Immunologic: negative for hay fever and urticaria        Objective:  Blood pressure 116/73, pulse 80, height 5\' 2"  (1.575 m), weight 157 lb 12.8 oz (71.6 kg), last menstrual period 10/13/2020. Body mass index is 28.86 kg/m.  General Appearance:    Alert, cooperative, no distress, appears stated age, overweight  Head:    Normocephalic, without obvious abnormality, atraumatic  Eyes:    PERRL, conjunctiva/corneas clear, EOM's intact, both eyes  Ears:    Normal external ear canals, both ears  Nose:   Nares normal, septum midline, mucosa normal, no drainage or sinus tenderness  Throat:   Lips, mucosa, and tongue normal; teeth and gums normal  Neck:   Supple, symmetrical, trachea midline, no adenopathy; thyroid: no enlargement/tenderness/nodules; no carotid bruit or JVD  Back:     Symmetric, no curvature, ROM normal, no CVA  tenderness  Lungs:     Clear to auscultation bilaterally, respirations unlabored  Chest Wall:    No tenderness or deformity   Heart:    Regular rate and rhythm, S1 and S2 normal, no murmur, rub or gallop  Breast Exam:    No tenderness, masses, or nipple abnormality. Faint residual macular rash noted under breasts.   Abdomen:     Soft, non-tender, bowel sounds active all four quadrants, no masses, no organomegaly.    Genitalia:    Pelvic:external genitalia normal, vagina without lesions, discharge, or tenderness.  Rectovaginal septum  normal. Cervix normal in appearance, no cervical motion tenderness, no adnexal masses or tenderness.  Uterus normal size, shape, mobile, regular contours, nontender.  Rectal:    Normal external sphincter.  No hemorrhoids appreciated. Internal exam not done.   Extremities:   Extremities normal, atraumatic, no cyanosis or edema  Pulses:  2+ and symmetric all extremities  Skin:   Skin color, texture, turgor normal, no rashes or lesions  Lymph nodes:   Cervical, supraclavicular, and axillary nodes normal  Neurologic:   CNII-XII intact, normal strength, sensation and reflexes throughout   .  Labs:  Lab Results  Component Value Date   WBC 5.5 02/20/2018   HGB 13.6 02/20/2018   HCT 41.6 02/20/2018   MCV 89 02/20/2018   PLT 246 02/20/2018    Lab Results  Component Value Date   CREATININE 0.59 02/20/2018   BUN 14 02/20/2018   NA 139 02/20/2018   K 4.4 02/20/2018   CL 99 02/20/2018   CO2 26 02/20/2018    Lab Results  Component Value Date   ALT 17 02/20/2018   AST 19 02/20/2018   ALKPHOS 72 02/20/2018   BILITOT 0.5 02/20/2018    No results found for: TSH   Assessment:   Healthy female exam. Contraception management Overweight Breast issues  Plan:     Blood tests: See orders.  Breast self exam technique reviewed and patient encouraged to perform self-exam monthly. Contraception: Mirena IUD.  Discussed healthy lifestyle modifications. Pap  smear up to date.  COVID vaccination status: Completed series. For booster when eligible.  Flu vaccine: declined.  Referral to Plastic surgery for evaluation of breast reduction. Breasts slightly larger proportion for body habitus. . Also discussed use of powder for decreasing moisture, look into minimize bras for slight appearance of reduction in size.  Follow up in 1 year.     Hildred Laser, MD [facility]

## 2020-10-20 NOTE — Progress Notes (Signed)
Pt present for annual exam. Pt stated that she was doing well and decline flu vaccine at this time. Pt would like to discuss breast reduction.

## 2020-10-20 NOTE — Patient Instructions (Signed)
Breast Self-Awareness Breast self-awareness means being familiar with how your breasts look and feel. It involves checking your breasts regularly and reporting any changes to your health care provider. Practicing breast self-awareness is important. Sometimes changes may not be harmful (are benign), but sometimes a change in your breasts can be a sign of a serious medical problem. It is important to learn how to do this procedure correctly so that you can catch problems early, when treatment is more likely to be successful. All women should practice breast self-awareness, including women who have had breast implants. What you need:  A mirror.  A well-lit room. How to do a breast self-exam A breast self-exam is one way to learn what is normal for your breasts and whether your breasts are changing. To do a breast self-exam: Look for changes  1. Remove all the clothing above your waist. 2. Stand in front of a mirror in a room with good lighting. 3. Put your hands on your hips. 4. Push your hands firmly downward. 5. Compare your breasts in the mirror. Look for differences between them (asymmetry), such as: ? Differences in shape. ? Differences in size. ? Puckers, dips, and bumps in one breast and not the other. 6. Look at each breast for changes in the skin, such as: ? Redness. ? Scaly areas. 7. Look for changes in your nipples, such as: ? Discharge. ? Bleeding. ? Dimpling. ? Redness. ? A change in position. Feel for changes Carefully feel your breasts for lumps and changes. It is best to do this while lying on your back on the floor, and again while sitting or standing in the tub or shower with soapy water on your skin. Feel each breast in the following way: 1. Place the arm on the side of the breast you are examining above your head. 2. Feel your breast with the other hand. 3. Start in the nipple area and make -inch (2 cm) overlapping circles to feel your breast. Use the pads of  your three middle fingers to do this. Apply light pressure, then medium pressure, then firm pressure. The light pressure will allow you to feel the tissue closest to the skin. The medium pressure will allow you to feel the tissue that is a little deeper. The firm pressure will allow you to feel the tissue close to the ribs. 4. Continue the overlapping circles, moving downward over the breast until you feel your ribs below your breast. 5. Move one finger-width toward the center of the body. Continue to use the -inch (2 cm) overlapping circles to feel your breast as you move slowly up toward your collarbone. 6. Continue the up-and-down exam using all three pressures until you reach your armpit.  Write down what you find Writing down what you find can help you remember what to discuss with your health care provider. Write down:  What is normal for each breast.  Any changes that you find in each breast, including: ? The kind of changes you find. ? Any pain or tenderness. ? Size and location of any lumps.  Where you are in your menstrual cycle, if you are still menstruating. General tips and recommendations  Examine your breasts every month.  If you are breastfeeding, the best time to examine your breasts is after a feeding or after using a breast pump.  If you menstruate, the best time to examine your breasts is 5-7 days after your period. Breasts are generally lumpier during menstrual periods, and it  may be more difficult to notice changes.  With time and practice, you will become more familiar with the variations in your breasts and more comfortable with the exam. Contact a health care provider if you:  See a change in the shape or size of your breasts or nipples.  See a change in the skin of your breast or nipples, such as a reddened or scaly area.  Have unusual discharge from your nipples.  Find a lump or thick area that was not there before.  Have pain in your breasts.  Have  any concerns related to your breast health. Summary  Breast self-awareness includes looking for physical changes in your breasts, as well as feeling for any changes within your breasts.  Breast self-awareness should be performed in front of a mirror in a well-lit room.  You should examine your breasts every month. If you menstruate, the best time to examine your breasts is 5-7 days after your menstrual period.  Let your health care provider know of any changes you notice in your breasts, including changes in size, changes on the skin, pain or tenderness, or unusual fluid from your nipples. This information is not intended to replace advice given to you by your health care provider. Make sure you discuss any questions you have with your health care provider. Document Revised: 07/09/2018 Document Reviewed: 07/09/2018 Elsevier Patient Education  [place] 61-45 Years Old, Female Preventive care refers to visits with your health care provider and lifestyle choices that can promote health and wellness. This includes:  A yearly physical exam. This may also be called an annual well check.  Regular dental visits and eye exams.  Immunizations.  Screening for certain conditions.  Healthy lifestyle choices, such as eating a healthy diet, getting regular exercise, not using drugs or products that contain nicotine and tobacco, and limiting alcohol use. What can I expect for my preventive care visit? Physical exam Your health care provider will check your:  Height and weight. This may be used to calculate body mass index (BMI), which tells if you are at a healthy weight.  Heart rate and blood pressure.  Skin for abnormal spots. Counseling Your health care provider may ask you questions about your:  Alcohol, tobacco, and drug use.  Emotional well-being.  Home and relationship well-being.  Sexual activity.  Eating habits.  Work and work Statistician.  Method  of birth control.  Menstrual cycle.  Pregnancy history. What immunizations do I need?  Influenza (flu) vaccine  This is recommended every year. Tetanus, diphtheria, and pertussis (Tdap) vaccine  You may need a Td booster every 10 years. Varicella (chickenpox) vaccine  You may need this if you have not been vaccinated. Human papillomavirus (HPV) vaccine  If recommended by your health care provider, you may need three doses over 6 months. Measles, mumps, and rubella (MMR) vaccine  You may need at least one dose of MMR. You may also need a second dose. Meningococcal conjugate (MenACWY) vaccine  One dose is recommended if you are age 66-21 years and a first-year college student living in a residence hall, or if you have one of several medical conditions. You may also need additional booster doses. Pneumococcal conjugate (PCV13) vaccine  You may need this if you have certain conditions and were not previously vaccinated. Pneumococcal polysaccharide (PPSV23) vaccine  You may need one or two doses if you smoke cigarettes or if you have certain conditions. Hepatitis A vaccine  You may need this  if you have certain conditions or if you travel or work in places where you may be exposed to hepatitis A. Hepatitis B vaccine  You may need this if you have certain conditions or if you travel or work in places where you may be exposed to hepatitis B. Haemophilus influenzae type b (Hib) vaccine  You may need this if you have certain conditions. You may receive vaccines as individual doses or as more than one vaccine together in one shot (combination vaccines). Talk with your health care provider about the risks and benefits of combination vaccines. What tests do I need?  Blood tests  Lipid and cholesterol levels. These may be checked every 5 years starting at age 20.  Hepatitis C test.  Hepatitis B test. Screening  Diabetes screening. This is done by checking your blood sugar  (glucose) after you have not eaten for a while (fasting).  Sexually transmitted disease (STD) testing.  BRCA-related cancer screening. This may be done if you have a family history of breast, ovarian, tubal, or peritoneal cancers.  Pelvic exam and Pap test. This may be done every 3 years starting at age 47. Starting at age 37, this may be done every 5 years if you have a Pap test in combination with an HPV test. Talk with your health care provider about your test results, treatment options, and if necessary, the need for more tests. Follow these instructions at home: Eating and drinking   Eat a diet that includes fresh fruits and vegetables, whole grains, lean protein, and low-fat dairy.  Take vitamin and mineral supplements as recommended by your health care provider.  Do not drink alcohol if: ? Your health care provider tells you not to drink. ? You are pregnant, may be pregnant, or are planning to become pregnant.  If you drink alcohol: ? Limit how much you have to 0-1 drink a day. ? Be aware of how much alcohol is in your drink. In the [place] one drink equals one 12 oz bottle of beer (355 mL), one 5 oz glass of wine (148 mL), or one 1 oz glass of hard liquor (44 mL). Lifestyle  Take daily care of your teeth and gums.  Stay active. Exercise for at least 30 minutes on 5 or more days each week.  Do not use any products that contain nicotine or tobacco, such as cigarettes, e-cigarettes, and chewing tobacco. If you need help quitting, ask your health care provider.  If you are sexually active, practice safe sex. Use a condom or other form of birth control (contraception) in order to prevent pregnancy and STIs (sexually transmitted infections). If you plan to become pregnant, see your health care provider for a preconception visit. What's next?  Visit your health care provider once a year for a well check visit.  Ask your health care provider how often you should have your eyes and  teeth checked.  Stay up to date on all vaccines. This information is not intended to replace advice given to you by your health care provider. Make sure you discuss any questions you have with your health care provider. Document Revised: 08/01/2018 Document Reviewed: 08/01/2018 Elsevier Patient Education  2020 [employer].

## 2020-10-21 LAB — TSH: TSH: 2.01 u[IU]/mL (ref 0.450–4.500)

## 2020-10-21 LAB — COMPREHENSIVE METABOLIC PANEL
ALT: 12 IU/L (ref 0–32)
AST: 15 IU/L (ref 0–40)
Albumin/Globulin Ratio: 2.1 (ref 1.2–2.2)
Albumin: 4.7 g/dL (ref 3.9–5.0)
Alkaline Phosphatase: 57 IU/L (ref 44–121)
BUN/Creatinine Ratio: 16 (ref 9–23)
BUN: 12 mg/dL (ref 6–20)
Bilirubin Total: 0.5 mg/dL (ref 0.0–1.2)
CO2: 22 mmol/L (ref 20–29)
Calcium: 9.6 mg/dL (ref 8.7–10.2)
Chloride: 105 mmol/L (ref 96–106)
Creatinine, Ser: 0.74 mg/dL (ref 0.57–1.00)
GFR calc Af Amer: 126 mL/min/{1.73_m2} (ref >59)
GFR calc non Af Amer: 109 mL/min/{1.73_m2} (ref >59)
Globulin, Total: 2.2 g/dL (ref 1.5–4.5)
Glucose: 96 mg/dL (ref 65–99)
Potassium: 3.9 mmol/L (ref 3.5–5.2)
Sodium: 144 mmol/L (ref 134–144)
Total Protein: 6.9 g/dL (ref 6.0–8.5)

## 2020-10-21 LAB — CBC
Hematocrit: 40.7 % (ref 34.0–46.6)
Hemoglobin: 13.7 g/dL (ref 11.1–15.9)
MCH: 29.3 pg (ref 26.6–33.0)
MCHC: 33.7 g/dL (ref 31.5–35.7)
MCV: 87 fL (ref 79–97)
Platelets: 230 10*3/uL (ref 150–450)
RBC: 4.67 x10E6/uL (ref 3.77–5.28)
RDW: 11.6 % — ABNORMAL LOW (ref 11.7–15.4)
WBC: 6 10*3/uL (ref 3.4–10.8)

## 2020-10-21 LAB — HEMOGLOBIN A1C
Est. average glucose Bld gHb Est-mCnc: 111 mg/dL
Hgb A1c MFr Bld: 5.5 % (ref 4.8–5.6)

## 2020-10-27 ENCOUNTER — Encounter: Payer: BLUE CROSS/BLUE SHIELD | Admitting: Obstetrics and Gynecology

## 2020-11-11 ENCOUNTER — Other Ambulatory Visit: Payer: Self-pay

## 2020-11-11 ENCOUNTER — Encounter: Payer: Self-pay | Admitting: Plastic Surgery

## 2020-11-11 ENCOUNTER — Ambulatory Visit (INDEPENDENT_AMBULATORY_CARE_PROVIDER_SITE_OTHER): Payer: BC Managed Care – PPO | Admitting: Plastic Surgery

## 2020-11-11 VITALS — BP 116/63 | HR 77 | Temp 98.3°F | Ht 62.0 in | Wt 156.8 lb

## 2020-11-11 DIAGNOSIS — M4004 Postural kyphosis, thoracic region: Secondary | ICD-10-CM | POA: Diagnosis not present

## 2020-11-11 DIAGNOSIS — M546 Pain in thoracic spine: Secondary | ICD-10-CM

## 2020-11-11 DIAGNOSIS — M545 Low back pain, unspecified: Secondary | ICD-10-CM

## 2020-11-11 DIAGNOSIS — N62 Hypertrophy of breast: Secondary | ICD-10-CM

## 2020-11-11 NOTE — Progress Notes (Signed)
   Referring Provider Hildred Laser, MD [address] [place],  [place] [address]   CC:  Chief Complaint  Patient presents with  . Advice Only      Hannah Larson is an 29 y.o. female.  HPI: Patient presents to discuss breast reduction.  She is 8 years of back pain, neck pain and shoulder grooving that she attributes to her large breast.  She tried over-the-counter medications, warm packs, cold packs and supportive bras with little relief.  She gets rashes beneath her breast that been refractory to over-the-counter treatment.  She has been to a chiropractor with limited sustained relief.  She does not smoke and is not diabetic.  She has not had any previous breast procedures or biopsies.  She is currently 38 cm wants to be around a B cup.  There is no family history of breast cancer.  No Known Allergies  Outpatient Encounter Medications as of 11/11/2020  Medication Sig  . levonorgestrel (MIRENA) 20 MCG/24HR IUD 1 each by Intrauterine route once.   No facility-administered encounter medications on file as of 11/11/2020.     Past Medical History:  Diagnosis Date  . Known health problems: none   . Medical history non-contributory     Past Surgical History:  Procedure Laterality Date  . NO PAST SURGERIES      Family History  Problem Relation Age of Onset  . Diabetes Sister   . Healthy Mother   . Healthy Father   . Breast cancer Neg Hx   . Migraines Neg Hx   . Colon cancer Neg Hx   . Heart disease Neg Hx     Social History   Social History Narrative  . Not on file     Review of Systems General: Denies fevers, chills, weight loss CV: Denies chest pain, shortness of breath, palpitations  Physical Exam Vitals with BMI 11/11/2020 10/20/2020 11/06/2018  Height 5\' 2"  5\' 2"  5\' 2"   Weight 156 lbs 13 oz 157 lbs 13 oz 156 lbs 8 oz  BMI 28.67 28.85 28.62  Systolic 116 116  Diastolic 63 73 75  Pulse 77 80 81    General:  No acute distress,  Alert and oriented,  Non-Toxic, Normal speech and affect Breast: She has grade 2 ptosis.  Sternal notch to nipple is 28 cm on the right and 29 cm on the left.  Nipple to fold is 13 cm on the right and 12 cm on the left.  I do not see any obvious scars or masses.  Assessment/Plan The patient has bilateral symptomatic macromastia.  She is a good candidate for a breast reduction.  She is interested in pursuing surgical treatment.  She has tried supportive garments and fitted bras with no relief.  The details of breast reduction surgery were discussed.  I explained the procedure in detail along the with the expected scars.  The risks were discussed in detail and include bleeding, infection, damage to surrounding structures, need for additional procedures, nipple loss, change in nipple sensation, persistent pain, contour irregularities and asymmetries.  I explained that breast feeding is often not possible after breast reduction surgery.  We discussed the expected postoperative course with an overall recovery period of about 1 month.  She demonstrated full understanding of all risks.  We discussed her personal risk factors.  I anticipate approximately 400g of tissue removed from each side.  11/11/2020, 2:08 PM

## 2020-11-15 DIAGNOSIS — M9902 Segmental and somatic dysfunction of thoracic region: Secondary | ICD-10-CM | POA: Diagnosis not present

## 2020-11-15 DIAGNOSIS — M546 Pain in thoracic spine: Secondary | ICD-10-CM | POA: Diagnosis not present

## 2020-11-15 DIAGNOSIS — M9901 Segmental and somatic dysfunction of cervical region: Secondary | ICD-10-CM | POA: Diagnosis not present

## 2020-11-15 DIAGNOSIS — M531 Cervicobrachial syndrome: Secondary | ICD-10-CM | POA: Diagnosis not present

## 2020-12-09 ENCOUNTER — Encounter: Payer: BLUE CROSS/BLUE SHIELD | Admitting: Obstetrics and Gynecology

## 2020-12-29 ENCOUNTER — Encounter: Payer: Self-pay | Admitting: Obstetrics and Gynecology

## 2020-12-29 ENCOUNTER — Ambulatory Visit (INDEPENDENT_AMBULATORY_CARE_PROVIDER_SITE_OTHER): Payer: BC Managed Care – PPO | Admitting: Obstetrics and Gynecology

## 2020-12-29 ENCOUNTER — Other Ambulatory Visit: Payer: Self-pay

## 2020-12-29 VITALS — BP 97/65 | HR 71 | Ht 62.0 in | Wt 152.3 lb

## 2020-12-29 DIAGNOSIS — K642 Third degree hemorrhoids: Secondary | ICD-10-CM | POA: Diagnosis not present

## 2020-12-29 NOTE — Progress Notes (Signed)
    GYNECOLOGY PROGRESS NOTE  Subjective:    Patient ID: Hannah Larson, female    DOB: 1991-10-24, 30 y.o.   MRN: [id number]  HPI  Patient is a 30 y.o. G67P2002 female who presents for possible hemorrhoids.  Symptoms ongoing for 1 week.  Noticing a "bump" when wiping. Notes her diet had recently changed and hs been experiencing more constipation recently. Just started using Preparation-H.   The following portions of the patient's history were reviewed and updated as appropriate: allergies, current medications, past family history, past medical history, past social history, past surgical history and problem list.  Review of Systems Pertinent items noted in HPI and remainder of comprehensive ROS otherwise negative.   Objective:   Blood pressure 97/65, pulse 71, height 5\' 2"  (1.575 m), weight 152 lb 4.8 oz (69.1 kg), last menstrual period 12/15/2020. General appearance: alert and no distress Pelvic: external genitalia normal, no lesions. Internal exam deferred.  Rectal exam: external hemorrhoids noted, non-tender. External sphincter with normal tone.   Assessment:   Hemorrhoids (external)  Plan:   Discussed home treatment measures for management of hemorrhoids (including high fiber diet, increasing water intake, Preparation-H, witch hazel, sitz baths). To return if symptoms worsen or fail to improve.    02/12/2021, MD [facility]

## 2020-12-29 NOTE — Patient Instructions (Signed)

## 2020-12-29 NOTE — Progress Notes (Signed)
Pt present for possible hemorrhoids. Pt stated that she noticed the hemorrhoids about a week ago. Pt voiced no pain and no itching from the area.

## 2020-12-31 ENCOUNTER — Encounter: Payer: Self-pay | Admitting: Obstetrics and Gynecology

## 2021-09-13 ENCOUNTER — Ambulatory Visit (INDEPENDENT_AMBULATORY_CARE_PROVIDER_SITE_OTHER): Payer: BC Managed Care – PPO | Admitting: Obstetrics and Gynecology

## 2021-09-13 ENCOUNTER — Encounter: Payer: Self-pay | Admitting: Obstetrics and Gynecology

## 2021-09-13 ENCOUNTER — Other Ambulatory Visit (HOSPITAL_COMMUNITY)
Admission: RE | Admit: 2021-09-13 | Discharge: 2021-09-13 | Disposition: A | Discharge status: Home / Self Care | Payer: BC Managed Care – PPO | Source: Ambulatory Visit | Attending: Obstetrics and Gynecology | Admitting: Obstetrics and Gynecology

## 2021-09-13 ENCOUNTER — Other Ambulatory Visit: Payer: Self-pay

## 2021-09-13 VITALS — BP 122/73 | HR 85 | Ht 62.0 in | Wt 141.8 lb

## 2021-09-13 DIAGNOSIS — Z124 Encounter for screening for malignant neoplasm of cervix: Secondary | ICD-10-CM

## 2021-09-13 DIAGNOSIS — Z13220 Encounter for screening for lipoid disorders: Secondary | ICD-10-CM

## 2021-09-13 DIAGNOSIS — Z131 Encounter for screening for diabetes mellitus: Secondary | ICD-10-CM

## 2021-09-13 DIAGNOSIS — Z01419 Encounter for gynecological examination (general) (routine) without abnormal findings: Secondary | ICD-10-CM

## 2021-09-13 DIAGNOSIS — L739 Follicular disorder, unspecified: Secondary | ICD-10-CM

## 2021-09-13 DIAGNOSIS — Z975 Presence of (intrauterine) contraceptive device: Secondary | ICD-10-CM

## 2021-09-13 NOTE — Progress Notes (Signed)
GYNECOLOGY ANNUAL PHYSICAL EXAM PROGRESS NOTE  Subjective:    Hannah Larson is a 30 y.o. G71P2002 female who presents for an annual exam.The patient is sexually active. The patient wears seatbelts: yes. The patient participates in regular exercise: yes (home workouts, weight lifting). Has the patient ever been transfused or tattooed?: no.   The patient has no complaints today:  1.  Patient reports a lesion on her right vulva x 1 week.  States that it does not itch or burn. Denies any drainage from area. Denies possible STI exposure.   Menstrual History: Menarche age: 86 Patient's last menstrual period was 09/03/2021 (exact date). Period Cycle (Days): 30 Period Duration (Days): 5-7 Period Pattern: Regular Menstrual Flow: Moderate Menstrual Control: Maxi pad Menstrual Control Change Freq (Hours): 2-3 Dysmenorrhea: None    Gynecologic History: Contraception: Mirena IUD (inserted 2018).   History of STI's: Denies Last Pap: 02/20/2018. Results were: normal.  Denies h/o abnormal pap smears.    Upstream - 09/13/21 1418       Pregnancy Intention Screening   Does the patient want to become pregnant in the next year? No    Does the patient's partner want to become pregnant in the next year? No    Would the patient like to discuss contraceptive options today? No      Contraception Wrap Up   Current Method IUD or IUS    End Method IUD or IUS    Contraception Counseling Provided No            The pregnancy intention screening data noted above was reviewed. Potential methods of contraception were discussed. The patient elected to proceed with IUD or IUS.     OB History  Gravida Para Term Preterm AB Living  2 2 2  0 0 2  SAB IAB Ectopic Multiple Live Births  0 0 0 0 2    # Outcome Date GA Lbr Len/2nd Weight Sex Delivery Anes PTL Lv  2 Term 07/02/17 [redacted]w[redacted]d 04:31 / 00:34 9 lb 14.4 oz (4.49 kg) M Vag-Spont Local  LIV     Name: Sydnor,BOY Aiana     Apgar1: 9  Apgar5: 9  1  Term 2016   8 lb 9.6 oz (3.901 kg) M Vag-Spont   LIV    Past Medical History:  Diagnosis Date   Known health problems: none    Medical history non-contributory      Past Surgical History:  Procedure Laterality Date   NO PAST SURGERIES      Family History  Problem Relation Age of Onset   Diabetes Sister    Healthy Mother    Healthy Father    Breast cancer Neg Hx    Migraines Neg Hx    Colon cancer Neg Hx    Heart disease Neg Hx     Social History   Socioeconomic History   Marital status: Married    Spouse name: Not on file   Number of children: Not on file   Years of education: Not on file   Highest education level: Not on file  Occupational History   Not on file  Tobacco Use   Smoking status: Never   Smokeless tobacco: Never  Vaping Use   Vaping Use: Never used  Substance and Sexual Activity   Alcohol use: No   Drug use: No   Sexual activity: Yes    Birth control/protection: I.U.D.  Other Topics Concern   Not on file  Social History Narrative  Not on file   Social Determinants of Health   Financial Resource Strain: Not on file  Food Insecurity: Not on file  Transportation Needs: Not on file  Physical Activity: Not on file  Stress: Not on file  Social Connections: Not on file  Intimate Partner Violence: Not on file    Current Outpatient Medications on File Prior to Visit  Medication Sig Dispense Refill   levonorgestrel (MIRENA) 20 MCG/24HR IUD 1 each by Intrauterine route once.     No current facility-administered medications on file prior to visit.    No Known Allergies   Review of Systems Constitutional: negative for chills, fatigue, fevers and sweats Eyes: negative for irritation, redness and visual disturbance Ears, nose, mouth, throat, and face: negative for hearing loss, nasal congestion, snoring and tinnitus Respiratory: negative for asthma, cough, sputum.  Cardiovascular: negative for chest pain, dyspnea, exertional chest  pressure/discomfort, irregular heart beat, palpitations and syncope Gastrointestinal: negative for abdominal pain, change in bowel habits, nausea and vomiting Genitourinary: negative for abnormal menstrual periods, sexual problems, dysuria and urinary incontinence. Notes lesion on right vulva x 1 week, no itching or burning.  Integument/breast: negative for breast lump, breast tenderness and nipple discharge.  Hematologic/lymphatic: negative for bleeding and easy bruising Musculoskeletal:negative for back pain and muscle weakness Neurological: negative for dizziness, headaches, vertigo and weakness Endocrine: negative for diabetic symptoms including polydipsia, polyuria and skin dryness Allergic/Immunologic: negative for hay fever and urticaria        Objective:  Blood pressure 122/73, pulse 85, height 5\' 2"  (1.575 m), weight 141 lb 12.8 oz (64.3 kg), last menstrual period 09/03/2021. Body mass index is 25.94 kg/m.  General Appearance:    Alert, cooperative, no distress, appears stated age, overweight  Head:    Normocephalic, without obvious abnormality, atraumatic  Eyes:    PERRL, conjunctiva/corneas clear, EOM's intact, both eyes  Ears:    Normal external ear canals, both ears  Nose:   Nares normal, septum midline, mucosa normal, no drainage or sinus tenderness  Throat:   Lips, mucosa, and tongue normal; teeth and gums normal  Neck:   Supple, symmetrical, trachea midline, no adenopathy; thyroid: no enlargement/tenderness/nodules; no carotid bruit or JVD  Back:     Symmetric, no curvature, ROM normal, no CVA tenderness  Lungs:     Clear to auscultation bilaterally, respirations unlabored  Chest Wall:    No tenderness or deformity   Heart:    Regular rate and rhythm, S1 and S2 normal, no murmur, rub or gallop  Breast Exam:    No tenderness, masses, or nipple abnormality.   Abdomen:     Soft, non-tender, bowel sounds active all four quadrants, no masses, no organomegaly.    Genitalia:     Pelvic:external genitalia with small raised lesion on right vulva, resembles folliculitis. Rectovaginal septum  normal. Normal vagina without lesions, small amount of mucoid discharge present.  Cervix normal in appearance, no cervical motion tenderness, no adnexal masses or tenderness, IUD threads visible approximately 3 cm in length.  Uterus normal size, shape, mobile, regular contours, nontender.  Rectal:    Normal external sphincter.  No hemorrhoids appreciated. Internal exam not done.   Extremities:   Extremities normal, atraumatic, no cyanosis or edema  Pulses:   2+ and symmetric all extremities  Skin:   Skin color, texture, turgor normal, no rashes or lesions  Lymph nodes:   Cervical, supraclavicular, and axillary nodes normal  Neurologic:   CNII-XII intact, normal strength, sensation and reflexes throughout   .  Labs:  Lab Results  Component Value Date   WBC 6.0 10/20/2020   HGB 13.7 10/20/2020   HCT 40.7 10/20/2020   MCV 87 10/20/2020   PLT 230 10/20/2020    Lab Results  Component Value Date   CREATININE 0.74 10/20/2020   BUN 12 10/20/2020   NA 144 10/20/2020   K 3.9 10/20/2020   CL 105 10/20/2020   CO2 22 10/20/2020    Lab Results  Component Value Date   ALT 12 10/20/2020   AST 15 10/20/2020   ALKPHOS 57 10/20/2020   BILITOT 0.5 10/20/2020    Lab Results  Component Value Date   TSH 2.010 10/20/2020     Assessment:   Healthy female exam. IUD in place Overweight Folliculitis  Plan:     Blood tests: See orders.  Breast self exam technique reviewed and patient encouraged to perform self-exam monthly. Contraception: Mirena IUD.  Discussed healthy lifestyle modifications. Pap smear performed today. COVID vaccination status: Completed series. Encouraged booster .  Flu vaccine: declined.  Discussed home treatment measures for folliculitis. Follow up in 1 year.    Hildred Laser, MD [facility]

## 2021-09-14 LAB — CBC
Hematocrit: 38.8 % (ref 34.0–46.6)
Hemoglobin: 13.2 g/dL (ref 11.1–15.9)
MCH: 29.9 pg (ref 26.6–33.0)
MCHC: 34 g/dL (ref 31.5–35.7)
MCV: 88 fL (ref 79–97)
Platelets: 214 10*3/uL (ref 150–450)
RBC: 4.42 x10E6/uL (ref 3.77–5.28)
RDW: 11.9 % (ref 11.7–15.4)
WBC: 4.7 10*3/uL (ref 3.4–10.8)

## 2021-09-14 LAB — HEMOGLOBIN A1C
Est. average glucose Bld gHb Est-mCnc: 105 mg/dL
Hgb A1c MFr Bld: 5.3 % (ref 4.8–5.6)

## 2021-09-14 LAB — LIPID PANEL
Chol/HDL Ratio: 2.9 ratio (ref 0.0–4.4)
Cholesterol, Total: 176 mg/dL (ref 100–199)
HDL: 61 mg/dL (ref >39)
LDL Chol Calc (NIH): 105 mg/dL — ABNORMAL HIGH (ref 0–99)
Triglycerides: 53 mg/dL (ref 0–149)
VLDL Cholesterol Cal: 10 mg/dL (ref 5–40)

## 2021-09-14 LAB — COMPREHENSIVE METABOLIC PANEL
ALT: 12 IU/L (ref 0–32)
AST: 15 IU/L (ref 0–40)
Albumin/Globulin Ratio: 2.3 — ABNORMAL HIGH (ref 1.2–2.2)
Albumin: 5 g/dL — ABNORMAL HIGH (ref 3.8–4.8)
Alkaline Phosphatase: 47 IU/L (ref 44–121)
BUN/Creatinine Ratio: 16 (ref 9–23)
BUN: 11 mg/dL (ref 6–20)
Bilirubin Total: 0.8 mg/dL (ref 0.0–1.2)
CO2: 23 mmol/L (ref 20–29)
Calcium: 9.2 mg/dL (ref 8.7–10.2)
Chloride: 102 mmol/L (ref 96–106)
Creatinine, Ser: 0.68 mg/dL (ref 0.57–1.00)
Globulin, Total: 2.2 g/dL (ref 1.5–4.5)
Glucose: 87 mg/dL (ref 70–99)
Potassium: 4.4 mmol/L (ref 3.5–5.2)
Sodium: 140 mmol/L (ref 134–144)
Total Protein: 7.2 g/dL (ref 6.0–8.5)
eGFR: 119 mL/min/{1.73_m2} (ref >59)

## 2021-09-14 LAB — THYROID PANEL WITH TSH
Free Thyroxine Index: 1.9 (ref 1.2–4.9)
T3 Uptake Ratio: 25 % (ref 24–39)
T4, Total: 7.6 ug/dL (ref 4.5–12.0)
TSH: 1.41 u[IU]/mL (ref 0.450–4.500)

## 2021-09-16 LAB — CYTOLOGY - PAP
Comment: NEGATIVE
Diagnosis: NEGATIVE
High risk HPV: NEGATIVE

## 2021-11-02 ENCOUNTER — Other Ambulatory Visit: Payer: Self-pay | Admitting: Neurology

## 2021-11-02 ENCOUNTER — Encounter: Payer: BC Managed Care – PPO | Admitting: Obstetrics and Gynecology

## 2021-11-02 DIAGNOSIS — G43809 Other migraine, not intractable, without status migrainosus: Secondary | ICD-10-CM

## 2021-11-15 ENCOUNTER — Ambulatory Visit: Payer: BC Managed Care – PPO

## 2021-11-22 ENCOUNTER — Ambulatory Visit
Admission: RE | Admit: 2021-11-22 | Discharge: 2021-11-22 | Disposition: A | Discharge status: Home / Self Care | Payer: BC Managed Care – PPO | Source: Ambulatory Visit | Attending: Neurology | Admitting: Neurology

## 2021-11-22 DIAGNOSIS — G43809 Other migraine, not intractable, without status migrainosus: Secondary | ICD-10-CM

## 2021-11-22 IMAGING — MR MR HEAD WO/W CM
14 series · 46 of 48 positions shown · IV contrast (gadavist)
Comparison: No pertinent prior exams available for comparison.

CLINICAL DATA: Vestibular migraine. Additional history provided by
scanning technologist: Patient reports weekly headaches for several
months (described as posterior pressure).

EXAM:
MRI HEAD WITHOUT AND WITH CONTRAST
TECHNIQUE: Multiplanar, multiecho pulse sequences of the brain and surrounding
structures were obtained without and with intravenous contrast.
CONTRAST:  6mL GADAVIST GADOBUTROL 1 MMOL/ML IV SOLN

[Series 5: ax dwi_tracew · axial · 3.0mm · 0.65mm/px · z∈[-109,+46]mm · 6 of 95 slices shown]
[im 1/95]
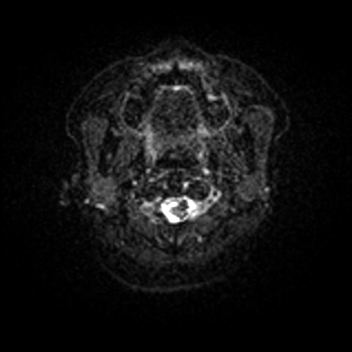
[im 19/95]
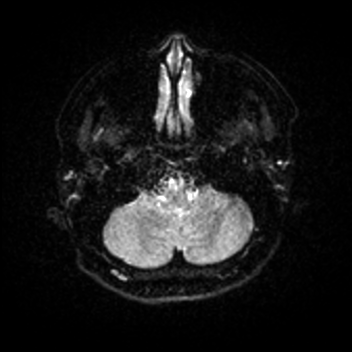
[im 38/95]
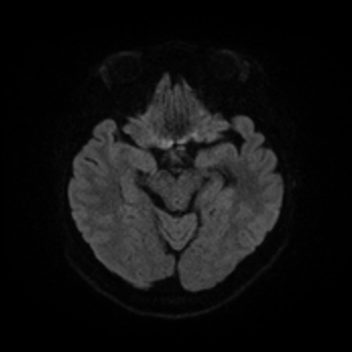
[im 57/95]
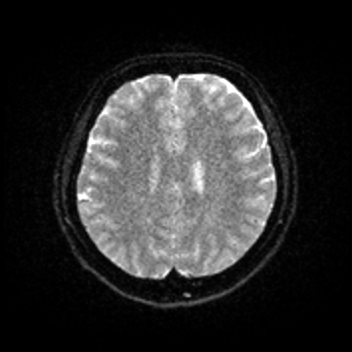
[im 76/95]
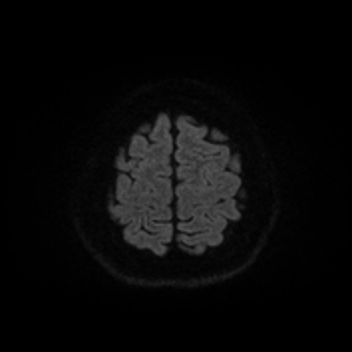
[im 95/95]
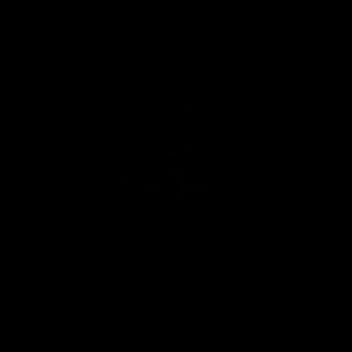

[Series 6: ax dwi_adc · axial · 3.0mm · 0.65mm/px · z∈[-109,+43]mm · 2 of 47 slices shown]
[im 1/47]
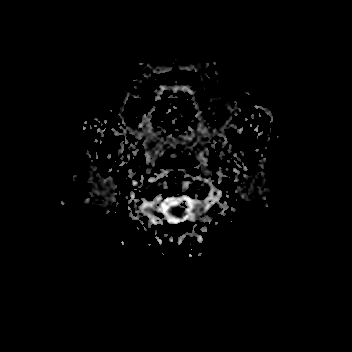
[im 47/47]
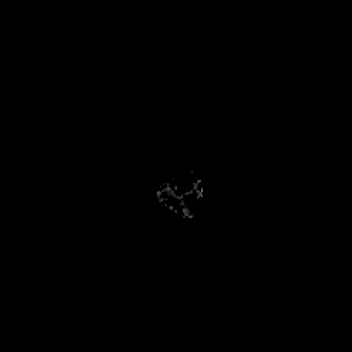

[Series 7: cor dwi_tracew · coronal · 5.0mm · 0.60mm/px · 4 of 68 slices shown]
[im 1/68]
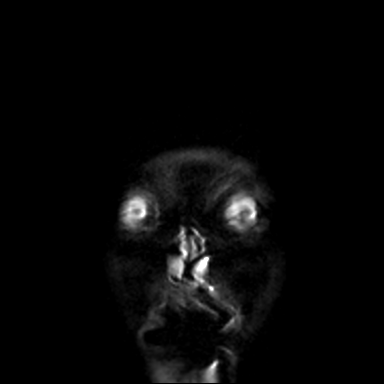
[im 23/68]
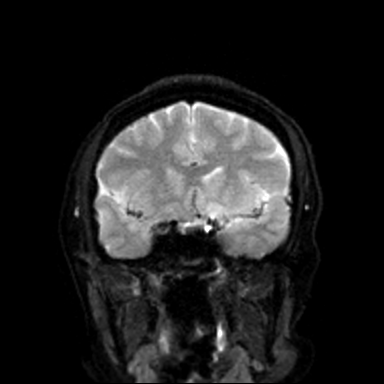
[im 45/68]
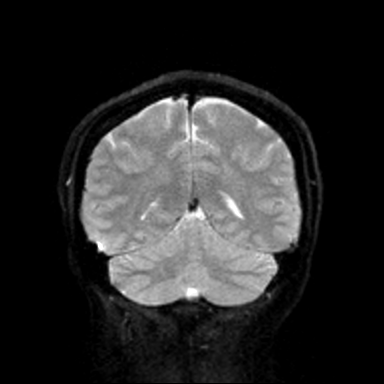
[im 68/68]
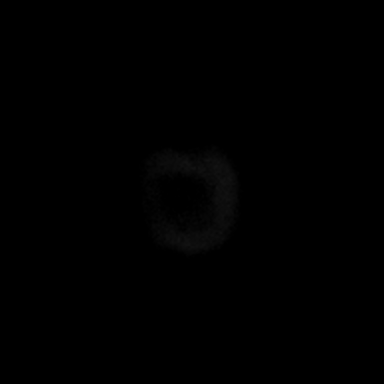

[Series 8: cor dwi_adc · coronal · 5.0mm · 0.60mm/px · 2 of 34 slices shown]
[im 1/34]
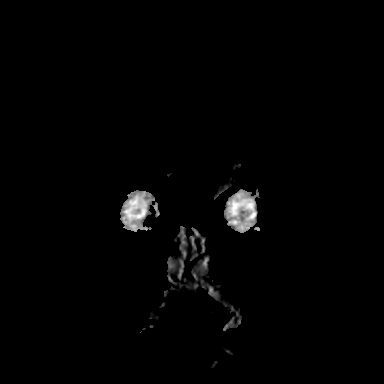
[im 34/34]
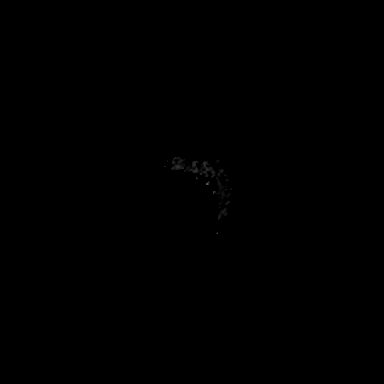

[Series 9: T1 · sagittal · 5.0mm · 0.62mm/px · 1 of 25 slices shown (1 of 2)]
[im 1/25]
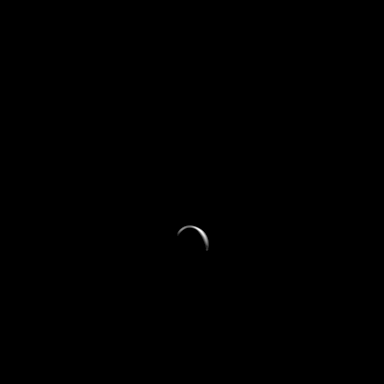

[Series 10: T2 · axial · 5.0mm · 0.53mm/px · 1 of 25 slices shown]
[im 1/25]
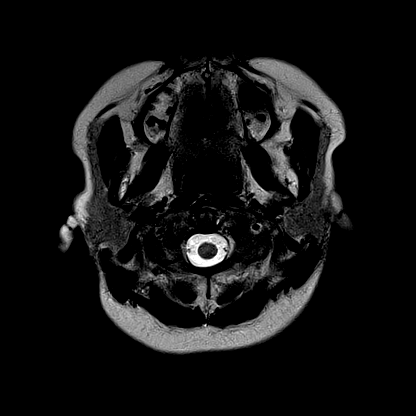

[Series 11: mag_images · axial · 3.0mm · 0.90mm/px · z∈[-120,+57]mm · 3 of 60 slices shown]
[im 1/60]
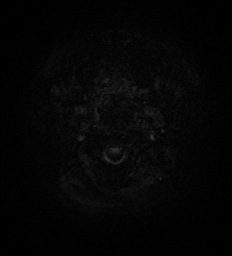
[im 30/60]
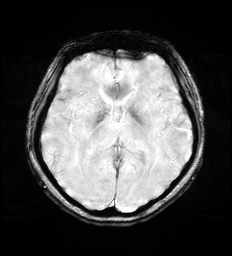
[im 60/60]
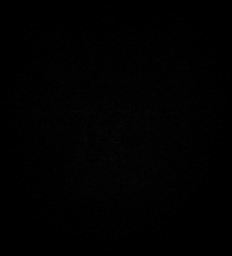

[Series 12: pha_images · axial · 3.0mm · 0.90mm/px · z∈[-120,+57]mm · 3 of 58 slices shown]
[im 1/58]
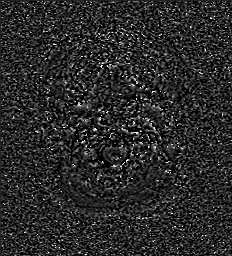
[im 29/58]
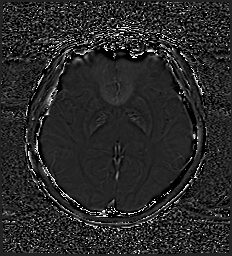
[im 58/58]
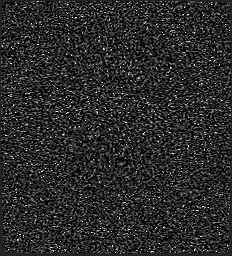

[Series 13: swi_images · axial · 3.0mm · 0.90mm/px · 1 of 60 slices shown]
[im 1/60]
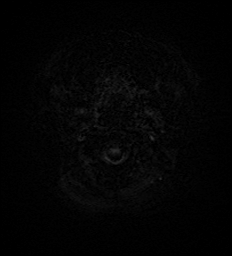

[Series 15: FLAIR · axial · 3.0mm · 0.53mm/px · z∈[-113,+49]mm · 3 of 55 slices shown]
[im 1/55]
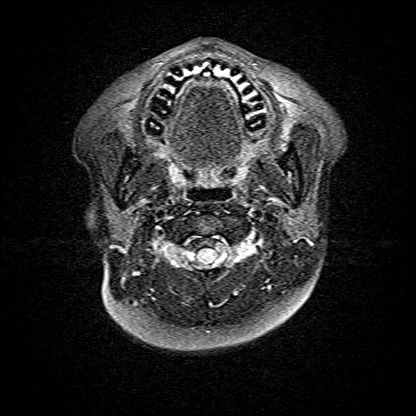
[im 28/55]
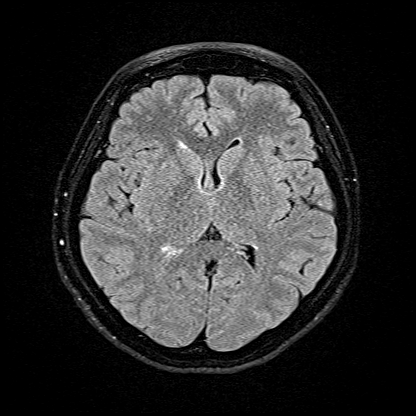
[im 55/55]
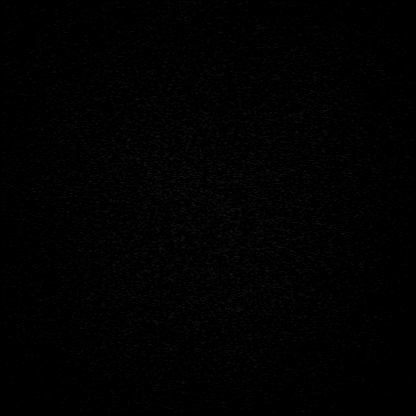

[Series 16: T1 · axial · 1.0mm · 0.98mm/px · z∈[-110,+49]mm · 8 of 160 slices shown (2 of 2)]
[im 1/160]
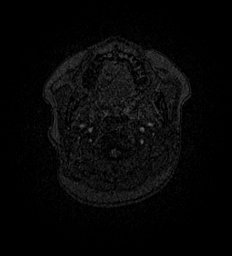
[im 23/160]
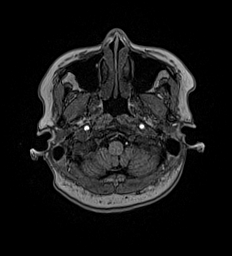
[im 46/160]
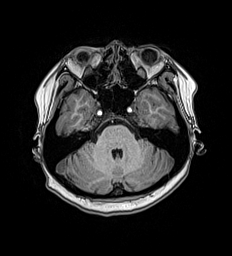
[im 69/160]
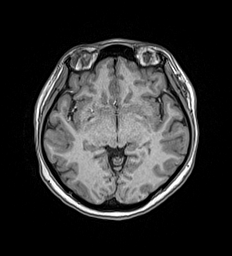
[im 91/160]
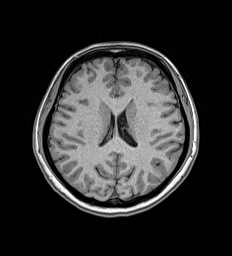
[im 114/160]
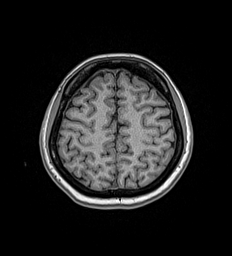
[im 137/160]
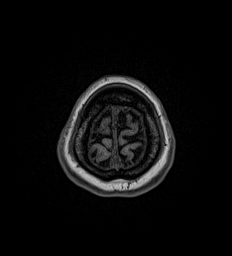
[im 160/160]
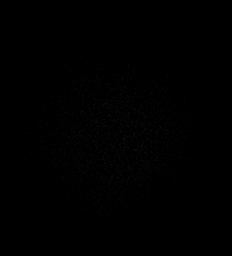

[Series 17: T2 post-contrast · coronal · 5.0mm · 0.57mm/px · 2 of 29 slices shown]
[im 1/29]
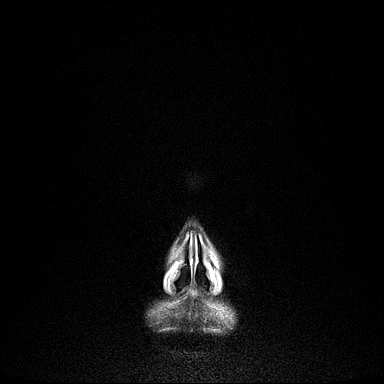
[im 29/29]
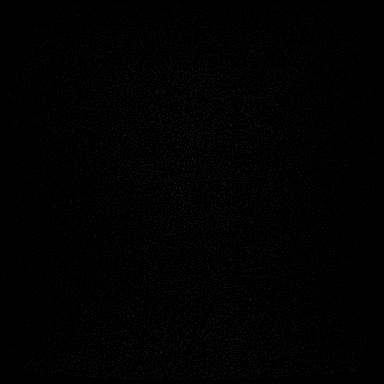

[Series 18: T1 post-contrast · axial · 1.0mm · 0.98mm/px · z∈[-110,+49]mm · 8 of 160 slices shown (1 of 2)]
[im 1/160]
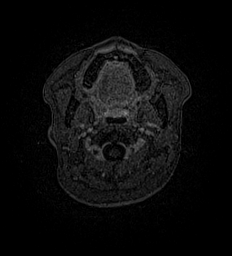
[im 23/160]
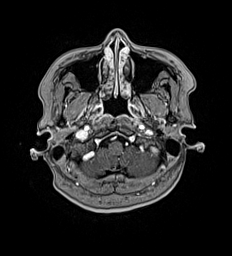
[im 46/160]
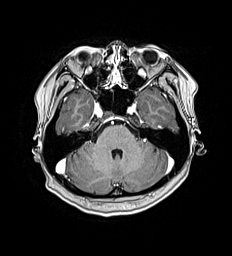
[im 69/160]
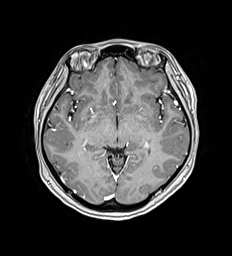
[im 91/160]
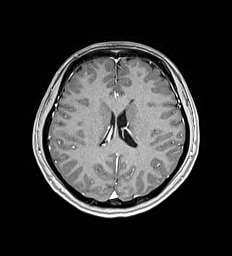
[im 114/160]
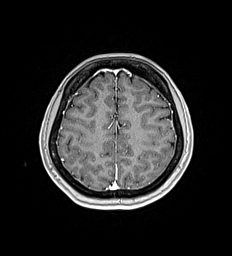
[im 137/160]
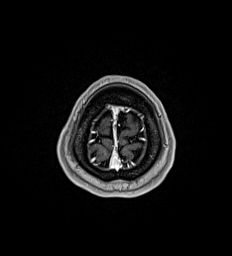
[im 160/160]
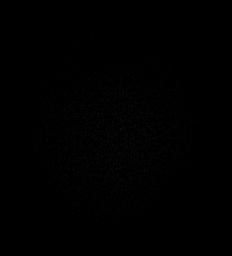

[Series 19: T1 post-contrast · coronal · 5.0mm · 0.57mm/px · 2 of 29 slices shown (2 of 2)]
[im 1/29]
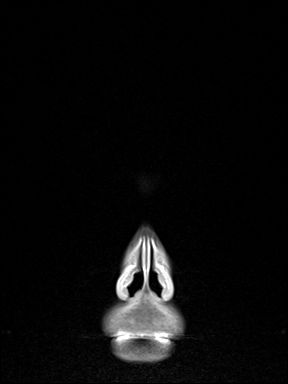
[im 29/29]
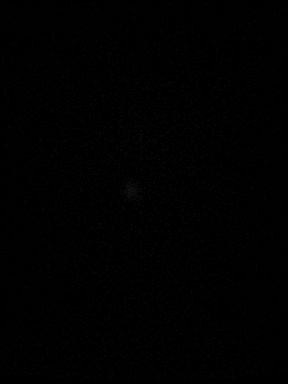

[46 of 48 positions shown; findings below may reference images not displayed]

FINDINGS: Brain:

Cerebral volume is normal.

6 mm focus of T2 FLAIR hyperintense signal abnormality within the
right subinsular white matter (for instance as seen on series 15,
image 26) (series 10, image 12).

2 mm somewhat nodular focus of enhancement within the fundus of the
left internal auditory canal (best appreciated on series 18, image
44).

There is no acute infarct.

No chronic intracranial blood products.

No extra-axial fluid collection.

No midline shift.

Vascular: Maintained flow voids within the proximal large arterial
vessels.

Skull and upper cervical spine: No focal suspicious marrow lesion.

Sinuses/Orbits: Visualized orbits show no acute finding. 18 mm
mucous retention cyst within the right maxillary sinus.

Impression #1 will be called to the ordering clinician or
representative by the Radiologist Assistant, and communication
documented in the PACS or [REDACTED].
IMPRESSION: 2 mm somewhat nodular focus of enhancement within the fundus of the
left internal auditory canal. This may simply reflect asymmetric
vascular enhancement. However, an internal auditory canal protocol
brain MRI with contrast is recommended to exclude a vestibular
schwannoma at the site.

Nonspecific T2 FLAIR hyperintense remote insult within the right
subinsular white matter.

Otherwise unremarkable MRI appearance the brain.

18 mm mucous retention cyst within the right maxillary sinus.

## 2021-11-22 MED ORDER — GADOBUTROL 1 MMOL/ML IV SOLN
6.0000 mL | Freq: Once | INTRAVENOUS | Status: AC | PRN
  Administered 2021-11-22: 18:00:00 6 mL via INTRAVENOUS

## 2021-12-02 ENCOUNTER — Other Ambulatory Visit: Payer: Self-pay | Admitting: Neurology

## 2021-12-02 ENCOUNTER — Other Ambulatory Visit (HOSPITAL_COMMUNITY): Payer: Self-pay | Admitting: Neurology

## 2021-12-02 DIAGNOSIS — Q165 Congenital malformation of inner ear: Secondary | ICD-10-CM

## 2021-12-16 ENCOUNTER — Other Ambulatory Visit: Payer: Self-pay

## 2021-12-16 ENCOUNTER — Ambulatory Visit
Admission: RE | Admit: 2021-12-16 | Discharge: 2021-12-16 | Disposition: A | Discharge status: Home / Self Care | Payer: BC Managed Care – PPO | Source: Ambulatory Visit | Attending: Neurology | Admitting: Neurology

## 2021-12-16 DIAGNOSIS — Q165 Congenital malformation of inner ear: Secondary | ICD-10-CM | POA: Insufficient documentation

## 2021-12-16 IMAGING — MR MR BRAIN/TEMPORAL BONE/IAC
10 of 14 series · 28 of 48 positions shown · IV contrast (gadavist)
Comparison: [DATE]

CLINICAL DATA: Prior MRI x3 weeks ago for headache, dizziness,
vertigo. Possible mass in LT IAC, follow up. No new symptoms.

EXAM:
MRI HEAD WITHOUT AND WITH CONTRAST
TECHNIQUE: Multiplanar, multiecho pulse sequences of the brain and surrounding
structures were obtained without and with intravenous contrast.
CONTRAST:  6mL GADAVIST GADOBUTROL 1 MMOL/ML IV SOLN

[Series 5: T1 · sagittal · 5.0mm · 0.62mm/px · 3 of 23 slices shown (1 of 3)]
[im 1/23]
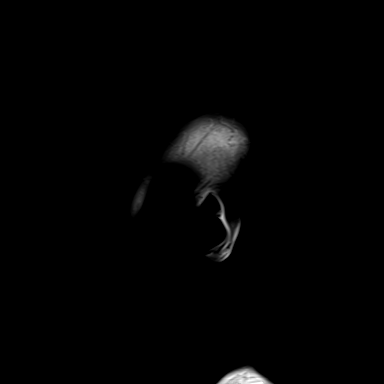
[im 12/23]
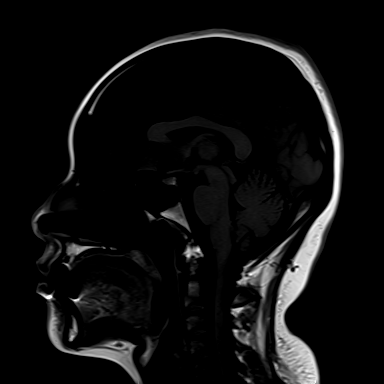
[im 23/23]
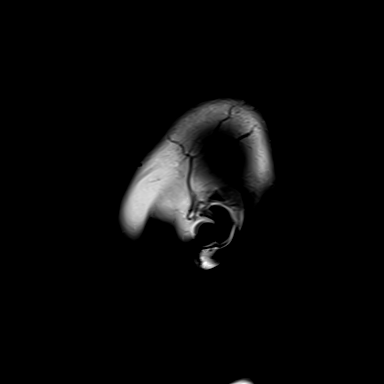

[Series 6: T2 · axial · 5.0mm · 0.53mm/px · z∈[-68,+76]mm · 2 of 25 slices shown]
[im 1/25]
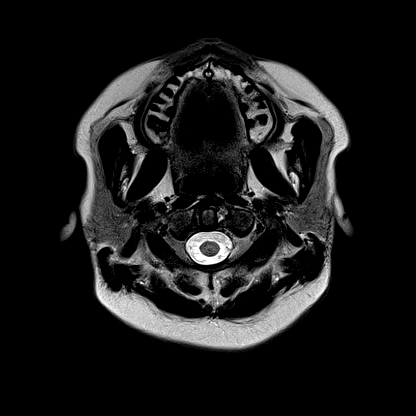
[im 25/25]
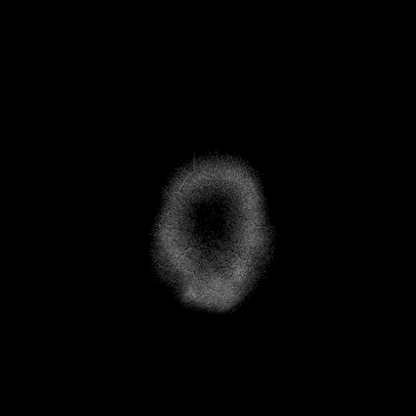

[Series 7: ax dwi_tracew · axial · 3.0mm · 0.65mm/px · z∈[-67,+75]mm · 3 of 44 slices shown]
[im 1/44]
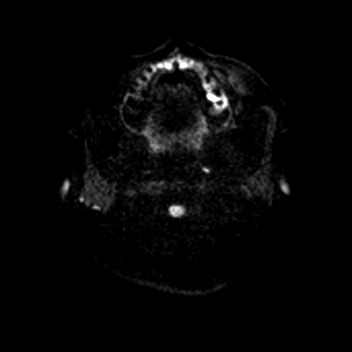
[im 22/44]
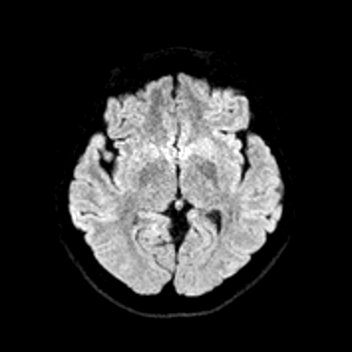
[im 44/44]
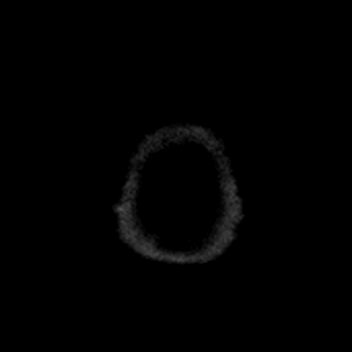

[Series 8: ax dwi_adc · axial · 3.0mm · 0.65mm/px · z∈[-67,+75]mm · 3 of 44 slices shown]
[im 1/44]
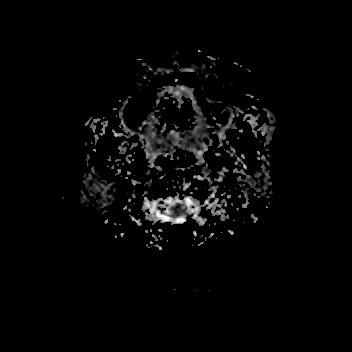
[im 22/44]
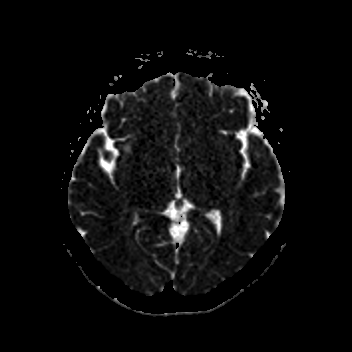
[im 44/44]
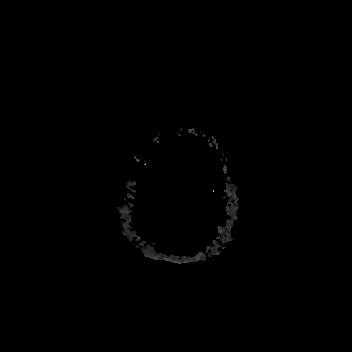

[Series 9: T1 · coronal · non-contrast · 3.0mm · 0.21mm/px · 1 of 13 slices shown (2 of 3)]
[im 1/13]
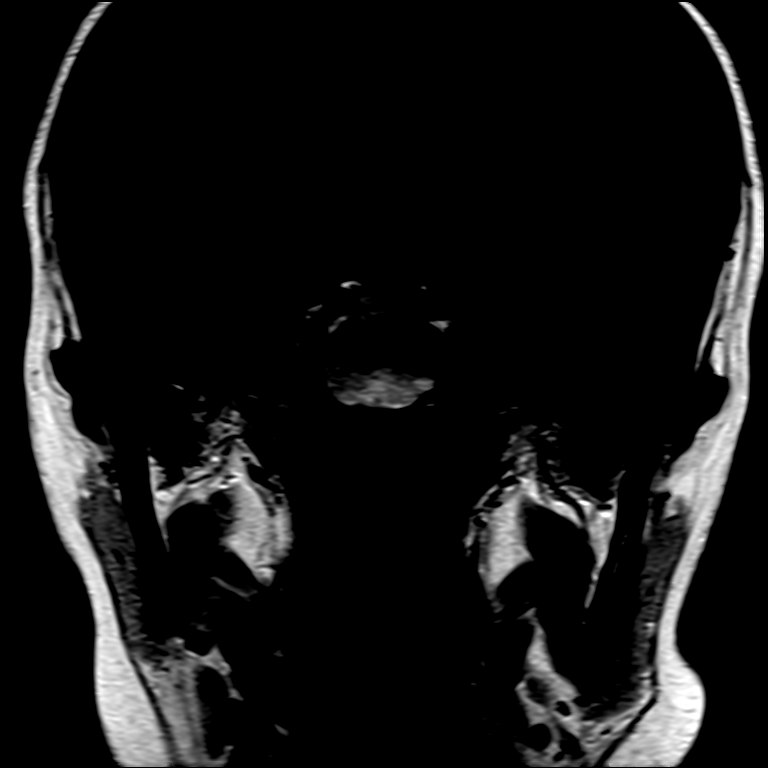

[Series 15: FLAIR · axial · 3.0mm · 0.53mm/px · z∈[-77,+85]mm · 4 of 55 slices shown]
[im 1/55]
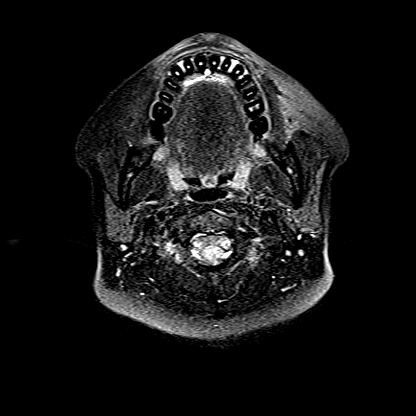
[im 19/55]
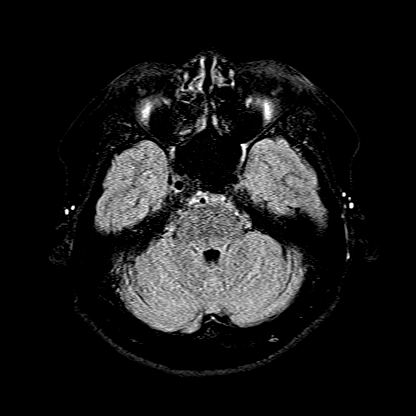
[im 37/55]
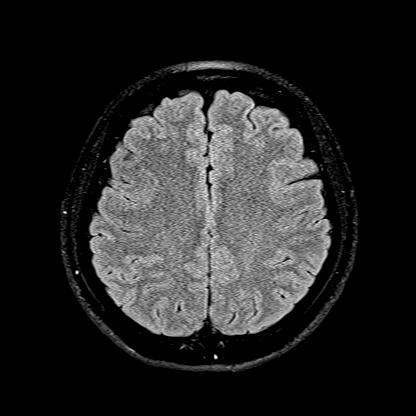
[im 55/55]
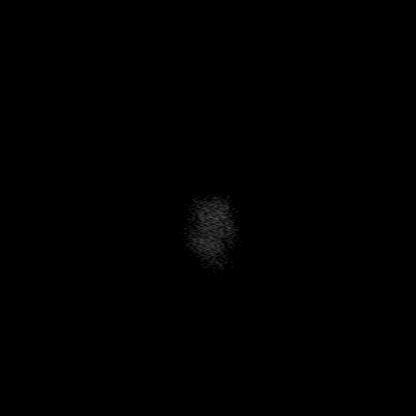

[Series 16: T1 · axial · non-contrast · 3.0mm · 0.21mm/px · 1 of 15 slices shown (3 of 3)]
[im 1/15]
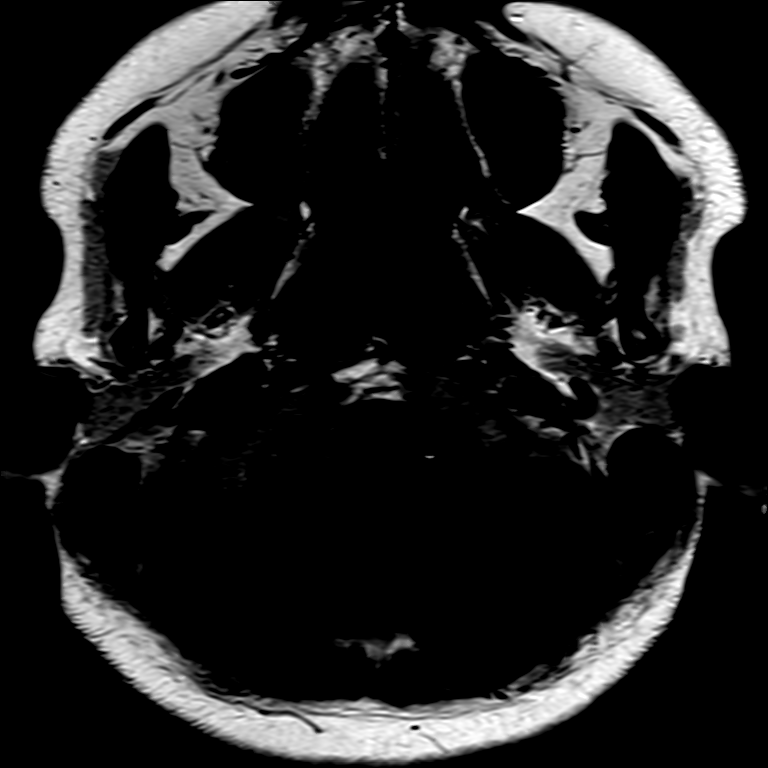

[Series 17: T1 post-contrast · axial · 3.0mm · 0.21mm/px · 1 of 15 slices shown (1 of 3)]
[im 1/15]
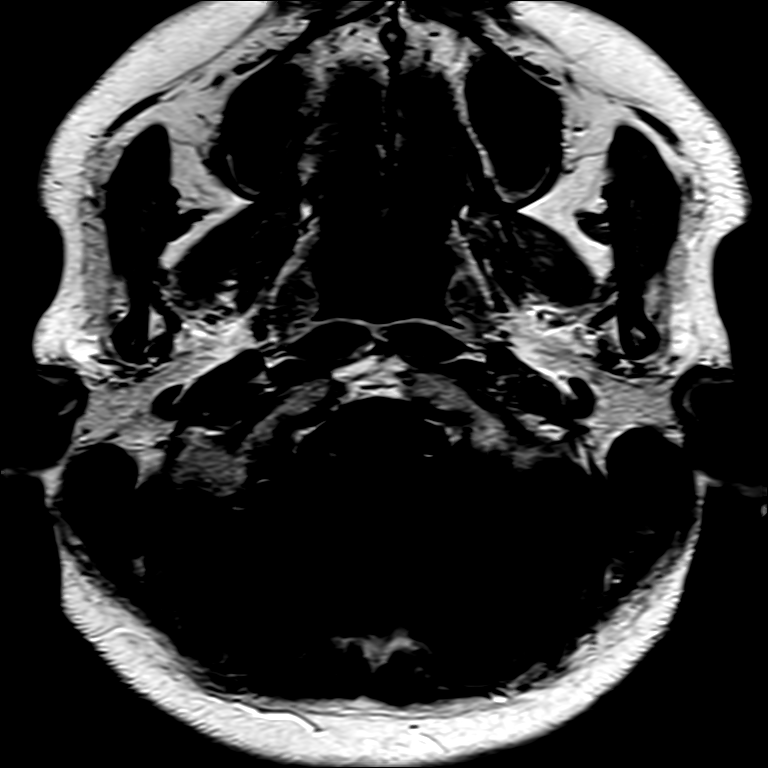

[Series 18: T1 post-contrast · coronal · 3.0mm · 0.21mm/px · 1 of 13 slices shown (2 of 3)]
[im 1/13]
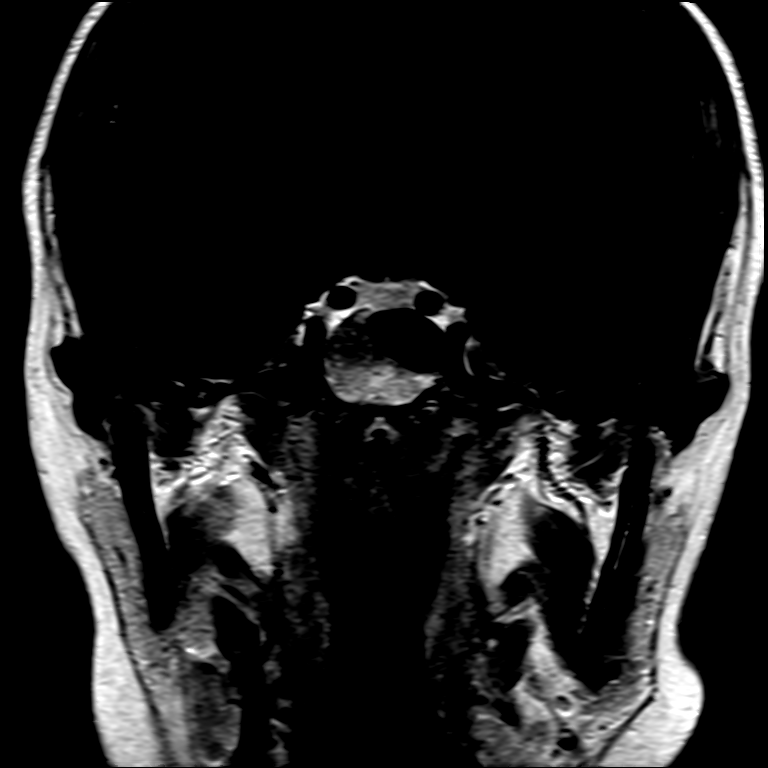

[Series 19: T1 post-contrast · axial · 1.0mm · 0.98mm/px · z∈[-83,+92]mm · 9 of 176 slices shown (3 of 3)]
[im 1/176]
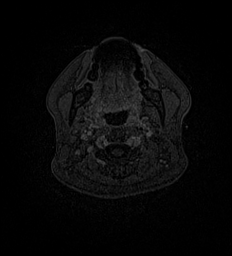
[im 30/176]
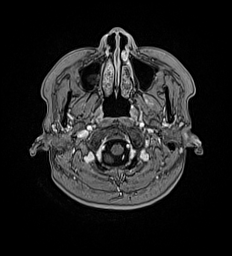
[im 59/176]
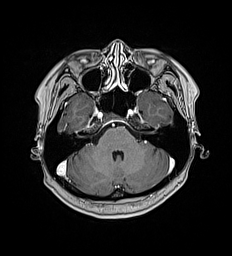
[im 73/176]
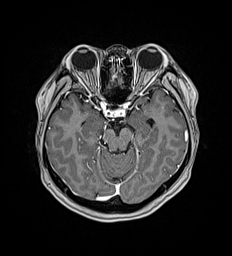
[im 88/176]
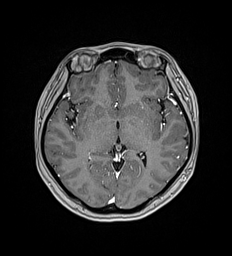
[im 103/176]
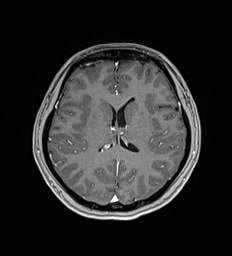
[im 117/176]
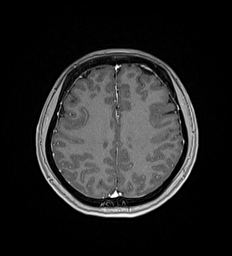
[im 146/176]
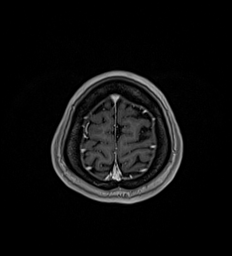
[im 176/176]
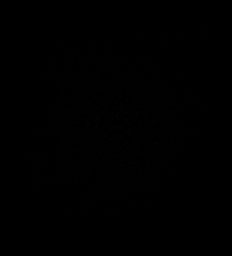

[28 of 48 positions shown; findings below may reference images not displayed]

FINDINGS: Brain: Negative for IAC mass or abnormal enhancement. The finding on
prior was presumably vascular. No reported history of Bell's palsy
or abnormal facial nerve enhancement. Normal appearance of the
brainstem and cisterns.

Single remote white matter insult in the right subinsular region
which is stable.

No acute or subacute infarct, hemorrhage, hydrocephalus, or
collection.

Vascular: Unremarkable flow voids and vascular enhancements

Skull and upper cervical spine: Normal marrow signal

Sinuses/Orbits: Negative
IMPRESSION: Negative for retrocochlear lesion.

## 2021-12-16 MED ORDER — GADOBUTROL 1 MMOL/ML IV SOLN
6.0000 mL | Freq: Once | INTRAVENOUS | Status: AC | PRN
  Administered 2021-12-16: 6 mL via INTRAVENOUS

## 2022-09-14 NOTE — Progress Notes (Signed)
GYNECOLOGY ANNUAL PHYSICAL EXAM PROGRESS NOTE  Subjective:    Hannah Larson is a 31 y.o. G16P2002 female who presents for an annual exam. The patient has no complaints today. The patient is sexually active. The patient participates in regular exercise: yes. Has the patient ever been transfused or tattooed?: yes. The patient reports that there is not domestic violence in her life.   Patient desires to have Vitamin B12 and D levels checked. Notes h/o deficiency last year.   Menstrual History: Menarche age: 60 No LMP recorded. (Menstrual status: IUD). Inserted (2018).     Gynecologic History:  Contraception: IUD History of STI's: Denies Last Pap: 09/13/2021. Results were: normal.  Denies h/o abnormal pap smears.    Upstream - 09/15/22 0859       Pregnancy Intention Screening   Does the patient want to become pregnant in the next year? No    Does the patient's partner want to become pregnant in the next year? No    Would the patient like to discuss contraceptive options today? N/A      Contraception Wrap Up   Current Method IUD or IUS    End Method IUD or IUS    Contraception Counseling Provided No    How was the end contraceptive method provided? N/A            The pregnancy intention screening data noted above was reviewed. Potential methods of contraception were discussed. The patient elected to proceed with IUD or IUS.   Past Medical History:  Diagnosis Date   Known health problems: none    Medical history non-contributory    Migraines 09/15/2022   Vertigo 09/15/2022    Past Surgical History:  Procedure Laterality Date   NO PAST SURGERIES      Family History  Problem Relation Age of Onset   Diabetes Sister    Healthy Mother    Healthy Father    Breast cancer Neg Hx    Migraines Neg Hx    Colon cancer Neg Hx    Heart disease Neg Hx     Social History   Socioeconomic History   Marital status: Married    Spouse name: Not on file   Number of  children: Not on file   Years of education: Not on file   Highest education level: Not on file  Occupational History   Not on file  Tobacco Use   Smoking status: Never   Smokeless tobacco: Never  Vaping Use   Vaping Use: Never used  Substance and Sexual Activity   Alcohol use: No   Drug use: No   Sexual activity: Yes    Birth control/protection: I.U.D.  Other Topics Concern   Not on file  Social History Narrative   Not on file   Social Determinants of Health   Financial Resource Strain: Not on file  Food Insecurity: Not on file  Transportation Needs: Not on file  Physical Activity: Not on file  Stress: Not on file  Social Connections: Not on file  Intimate Partner Violence: Not on file    Current Outpatient Medications on File Prior to Visit  Medication Sig Dispense Refill   levonorgestrel (MIRENA) 20 MCG/24HR IUD 1 each by Intrauterine route once.     No current facility-administered medications on file prior to visit.    No Known Allergies   Review of Systems Constitutional: negative for chills, fatigue, fevers and sweats Eyes: negative for irritation, redness and visual disturbance Ears, nose, mouth,  throat, and face: negative for hearing loss, nasal congestion, snoring and tinnitus. Positive for ear "clogging and irritation" bilaterally for several days.  Respiratory: negative for asthma, cough, sputum Cardiovascular: negative for chest pain, dyspnea, exertional chest pressure/discomfort, irregular heart beat, palpitations and syncope Gastrointestinal: negative for abdominal pain, change in bowel habits, nausea and vomiting Genitourinary: negative for abnormal menstrual periods, genital lesions, sexual problems and vaginal discharge, dysuria and urinary incontinence Integument/breast: negative for breast lump, breast tenderness and nipple discharge Hematologic/lymphatic: negative for bleeding and easy bruising Musculoskeletal:negative for back pain and muscle  weakness Neurological: negative for dizziness, headaches, vertigo and weakness Endocrine: negative for diabetic symptoms including polydipsia, polyuria and skin dryness Allergic/Immunologic: negative for hay fever and urticaria      Objective:  There were no vitals taken for this visit. There is no height or weight on file to calculate BMI.    General Appearance:    Alert, cooperative, no distress, appears stated age  Head:    Normocephalic, without obvious abnormality, atraumatic  Eyes:    PERRL, conjunctiva/corneas clear, EOM's intact, both eyes  Ears:    Normal external ear canals, both ears. Internal canal normal with tympanic membranes  Nose:   Nares normal, septum midline, mucosa normal, no drainage or sinus tenderness  Throat:   Lips, mucosa, and tongue normal; teeth and gums normal  Neck:   Supple, symmetrical, trachea midline, no adenopathy; thyroid: no enlargement/tenderness/nodules; no carotid bruit or JVD  Back:     Symmetric, no curvature, ROM normal, no CVA tenderness  Lungs:     Clear to auscultation bilaterally, respirations unlabored  Chest Wall:    No tenderness or deformity   Heart:    Regular rate and rhythm, S1 and S2 normal, no murmur, rub or gallop  Breast Exam:    No tenderness, masses, or nipple abnormality  Abdomen:     Soft, non-tender, bowel sounds active all four quadrants, no masses, no organomegaly.    Genitalia:    Pelvic:external genitalia normal, vagina without lesions, discharge, or tenderness, rectovaginal septum  normal. Cervix normal in appearance, no cervical motion tenderness, no adnexal masses or tenderness.  Uterus normal size, shape, mobile, regular contours, nontender.  Rectal:    Normal external sphincter.  No hemorrhoids appreciated. Internal exam not done.   Extremities:   Extremities normal, atraumatic, no cyanosis or edema  Pulses:   2+ and symmetric all extremities  Skin:   Skin color, texture, turgor normal, no rashes or lesions  Lymph  nodes:   Cervical, supraclavicular, and axillary nodes normal  Neurologic:   CNII-XII intact, normal strength, sensation and reflexes throughout   .  Labs:  Lab Results  Component Value Date   WBC 4.7 09/13/2021   HGB 13.2 09/13/2021   HCT 38.8 09/13/2021   MCV 88 09/13/2021   PLT 214 09/13/2021    Lab Results  Component Value Date   CREATININE 0.68 09/13/2021   BUN 11 09/13/2021   NA 140 09/13/2021   K 4.4 09/13/2021   CL 102 09/13/2021   CO2 23 09/13/2021    Lab Results  Component Value Date   ALT 12 09/13/2021   AST 15 09/13/2021   ALKPHOS 47 09/13/2021   BILITOT 0.8 09/13/2021    Lab Results  Component Value Date   TSH 1.410 09/13/2021     Assessment:   1. Encounter for well woman exam with routine gynecological exam   2. IUD (intrauterine device) in place   3. Vitamin D  deficiency   4. B12 deficiency      Plan:  Blood tests: CBC with diff, Comprehensive metabolic panel, and Vitamin D/B12. Breast self exam technique reviewed and patient encouraged to perform self-exam monthly. Contraception: IUD. Good for 8 years, due for removal in 2026.  Discussed healthy lifestyle modifications. Mammogram  : To begin at age 78. Pap smear  UTD . COVID vaccination status: Flu vaccine: Declines Ear discomfort likley due to allergies, no infection noted today. Advised on antihistamine use.  Follow up in 1 year for annual exam   Hildred Laser, MD [facility]

## 2022-09-15 ENCOUNTER — Encounter: Payer: Self-pay | Admitting: Obstetrics and Gynecology

## 2022-09-15 ENCOUNTER — Ambulatory Visit (INDEPENDENT_AMBULATORY_CARE_PROVIDER_SITE_OTHER): Payer: BC Managed Care – PPO | Admitting: Obstetrics and Gynecology

## 2022-09-15 VITALS — BP 103/65 | HR 67 | Ht 62.0 in | Wt 150.0 lb

## 2022-09-15 DIAGNOSIS — R42 Dizziness and giddiness: Secondary | ICD-10-CM

## 2022-09-15 DIAGNOSIS — Z975 Presence of (intrauterine) contraceptive device: Secondary | ICD-10-CM

## 2022-09-15 DIAGNOSIS — G43909 Migraine, unspecified, not intractable, without status migrainosus: Secondary | ICD-10-CM

## 2022-09-15 DIAGNOSIS — Z01419 Encounter for gynecological examination (general) (routine) without abnormal findings: Secondary | ICD-10-CM

## 2022-09-15 DIAGNOSIS — E559 Vitamin D deficiency, unspecified: Secondary | ICD-10-CM

## 2022-09-15 DIAGNOSIS — E538 Deficiency of other specified B group vitamins: Secondary | ICD-10-CM

## 2022-09-15 HISTORY — DX: Migraine, unspecified, not intractable, without status migrainosus: G43.909

## 2022-09-15 HISTORY — DX: Dizziness and giddiness: R42

## 2022-09-15 MED ORDER — VALACYCLOVIR HCL 1 G PO TABS: 1000.0000 mg | ORAL_TABLET | Freq: Every day | ORAL | 2 refills | Status: DC

## 2022-09-16 LAB — CBC
Hematocrit: 41.3 % (ref 34.0–46.6)
Hemoglobin: 13.8 g/dL (ref 11.1–15.9)
MCH: 29.7 pg (ref 26.6–33.0)
MCHC: 33.4 g/dL (ref 31.5–35.7)
MCV: 89 fL (ref 79–97)
Platelets: 192 10*3/uL (ref 150–450)
RBC: 4.64 x10E6/uL (ref 3.77–5.28)
RDW: 11.3 % — ABNORMAL LOW (ref 11.7–15.4)
WBC: 3.2 10*3/uL — ABNORMAL LOW (ref 3.4–10.8)

## 2022-09-16 LAB — COMPREHENSIVE METABOLIC PANEL
ALT: 17 IU/L (ref 0–32)
AST: 23 IU/L (ref 0–40)
Albumin/Globulin Ratio: 2 (ref 1.2–2.2)
Albumin: 4.7 g/dL (ref 3.9–4.9)
Alkaline Phosphatase: 47 IU/L (ref 44–121)
BUN/Creatinine Ratio: 21 (ref 9–23)
BUN: 15 mg/dL (ref 6–20)
Bilirubin Total: 0.4 mg/dL (ref 0.0–1.2)
CO2: 25 mmol/L (ref 20–29)
Calcium: 9.1 mg/dL (ref 8.7–10.2)
Chloride: 106 mmol/L (ref 96–106)
Creatinine, Ser: 0.72 mg/dL (ref 0.57–1.00)
Globulin, Total: 2.3 g/dL (ref 1.5–4.5)
Glucose: 91 mg/dL (ref 70–99)
Potassium: 5 mmol/L (ref 3.5–5.2)
Sodium: 141 mmol/L (ref 134–144)
Total Protein: 7 g/dL (ref 6.0–8.5)
eGFR: 114 mL/min/{1.73_m2} (ref >59)

## 2022-09-16 LAB — VITAMIN D 25 HYDROXY (VIT D DEFICIENCY, FRACTURES): Vit D, 25-Hydroxy: 31.8 ng/mL (ref 30.0–100.0)

## 2022-09-16 LAB — VITAMIN B12: Vitamin B-12: 709 pg/mL (ref 232–1245)

## 2022-09-19 ENCOUNTER — Other Ambulatory Visit: Payer: Self-pay

## 2022-09-19 ENCOUNTER — Telehealth: Payer: Self-pay

## 2022-09-19 DIAGNOSIS — Z0000 Encounter for general adult medical examination without abnormal findings: Secondary | ICD-10-CM

## 2022-09-19 NOTE — Telephone Encounter (Signed)
Pt verbalized understanding.

## 2022-09-20 ENCOUNTER — Other Ambulatory Visit: Payer: BC Managed Care – PPO

## 2022-09-20 DIAGNOSIS — Z0000 Encounter for general adult medical examination without abnormal findings: Secondary | ICD-10-CM

## 2022-09-21 LAB — HEMOGLOBIN A1C
Est. average glucose Bld gHb Est-mCnc: 111 mg/dL
Hgb A1c MFr Bld: 5.5 % (ref 4.8–5.6)

## 2022-09-21 LAB — THYROID PANEL WITH TSH
Free Thyroxine Index: 1.8 (ref 1.2–4.9)
T3 Uptake Ratio: 24 % (ref 24–39)
T4, Total: 7.7 ug/dL (ref 4.5–12.0)
TSH: 1.88 u[IU]/mL (ref 0.450–4.500)

## 2022-09-21 LAB — LIPID PANEL
Chol/HDL Ratio: 3.5 ratio (ref 0.0–4.4)
Cholesterol, Total: 185 mg/dL (ref 100–199)
HDL: 53 mg/dL (ref >39)
LDL Chol Calc (NIH): 121 mg/dL — ABNORMAL HIGH (ref 0–99)
Triglycerides: 56 mg/dL (ref 0–149)
VLDL Cholesterol Cal: 11 mg/dL (ref 5–40)

## 2023-09-17 NOTE — Progress Notes (Unsigned)
GYNECOLOGY ANNUAL PHYSICAL EXAM PROGRESS NOTE  Subjective:    Hannah Larson is a 32 y.o. G8P2002 female who presents for an annual exam.  The patient is sexually active. The patient participates in regular exercise: yes. Has the patient ever been transfused or tattooed?: yes. The patient reports that there is not domestic violence in her life.   The patient has the following complaints today:   Menstrual History: Menarche age: 74 No LMP recorded. (Menstrual status: IUD).     Gynecologic History:  Contraception: IUD History of STI's: Denies Last Pap: 09/13/2021. Results were: normal.  Denies h/o abnormal pap smears. Last mammogram: Not age appropriate      OB History  Gravida Para Term Preterm AB Living  2 2 2  0 0 2  SAB IAB Ectopic Multiple Live Births  0 0 0 0 2    # Outcome Date GA Lbr Len/2nd Weight Sex Type Anes PTL Lv  2 Term 07/02/17 [redacted]w[redacted]d 04:31 / 00:34 9 lb 14.4 oz (4.49 kg) M Vag-Spont Local  LIV     Name: Waltrip,BOY Davey     Apgar1: 9  Apgar5: 9  1 Term 2016   8 lb 9.6 oz (3.901 kg) M Vag-Spont   LIV    Past Medical History:  Diagnosis Date   Migraines 09/15/2022   Vertigo 09/15/2022    Past Surgical History:  Procedure Laterality Date   NO PAST SURGERIES      Family History  Problem Relation Age of Onset   Diabetes Sister    Healthy Mother    Healthy Father    Breast cancer Neg Hx    Migraines Neg Hx    Colon cancer Neg Hx    Heart disease Neg Hx     Social History   Socioeconomic History   Marital status: Married    Spouse name: Not on file   Number of children: Not on file   Years of education: Not on file   Highest education level: Not on file  Occupational History   Not on file  Tobacco Use   Smoking status: Never   Smokeless tobacco: Never  Vaping Use   Vaping status: Never Used  Substance and Sexual Activity   Alcohol use: No   Drug use: No   Sexual activity: Yes    Birth control/protection: I.U.D.  Other Topics  Concern   Not on file  Social History Narrative   Not on file   Social Determinants of Health   Financial Resource Strain: Not on file  Food Insecurity: Not on file  Transportation Needs: Not on file  Physical Activity: Not on file  Stress: Not on file  Social Connections: Not on file  Intimate Partner Violence: Not on file    Current Outpatient Medications on File Prior to Visit  Medication Sig Dispense Refill   levonorgestrel (MIRENA) 20 MCG/24HR IUD 1 each by Intrauterine route once.     SUMAtriptan (IMITREX) 100 MG tablet May take a second dose after 2 hours if needed.     No current facility-administered medications on file prior to visit.    No Known Allergies   Review of Systems Constitutional: negative for chills, fatigue, fevers and sweats Eyes: negative for irritation, redness and visual disturbance Ears, nose, mouth, throat, and face: negative for hearing loss, nasal congestion, snoring and tinnitus Respiratory: negative for asthma, cough, sputum Cardiovascular: negative for chest pain, dyspnea, exertional chest pressure/discomfort, irregular heart beat, palpitations and syncope Gastrointestinal: negative for abdominal  pain, change in bowel habits, nausea and vomiting Genitourinary: negative for abnormal menstrual periods, genital lesions, sexual problems and vaginal discharge, dysuria and urinary incontinence Integument/breast: negative for breast lump, breast tenderness and nipple discharge Hematologic/lymphatic: negative for bleeding and easy bruising Musculoskeletal:negative for back pain and muscle weakness Neurological: negative for dizziness, headaches, vertigo and weakness Endocrine: negative for diabetic symptoms including polydipsia, polyuria and skin dryness Allergic/Immunologic: negative for hay fever and urticaria      Objective:  There were no vitals taken for this visit. There is no height or weight on file to calculate BMI.    General  Appearance:    Alert, cooperative, no distress, appears stated age  Head:    Normocephalic, without obvious abnormality, atraumatic  Eyes:    PERRL, conjunctiva/corneas clear, EOM's intact, both eyes  Ears:    Normal external ear canals, both ears  Nose:   Nares normal, septum midline, mucosa normal, no drainage or sinus tenderness  Throat:   Lips, mucosa, and tongue normal; teeth and gums normal  Neck:   Supple, symmetrical, trachea midline, no adenopathy; thyroid: no enlargement/tenderness/nodules; no carotid bruit or JVD  Back:     Symmetric, no curvature, ROM normal, no CVA tenderness  Lungs:     Clear to auscultation bilaterally, respirations unlabored  Chest Wall:    No tenderness or deformity   Heart:    Regular rate and rhythm, S1 and S2 normal, no murmur, rub or gallop  Breast Exam:    No tenderness, masses, or nipple abnormality  Abdomen:     Soft, non-tender, bowel sounds active all four quadrants, no masses, no organomegaly.    Genitalia:    Pelvic:external genitalia normal, vagina without lesions, discharge, or tenderness, rectovaginal septum  normal. Cervix normal in appearance, no cervical motion tenderness, no adnexal masses or tenderness.  Uterus normal size, shape, mobile, regular contours, nontender.  Rectal:    Normal external sphincter.  No hemorrhoids appreciated. Internal exam not done.   Extremities:   Extremities normal, atraumatic, no cyanosis or edema  Pulses:   2+ and symmetric all extremities  Skin:   Skin color, texture, turgor normal, no rashes or lesions  Lymph nodes:   Cervical, supraclavicular, and axillary nodes normal  Neurologic:   CNII-XII intact, normal strength, sensation and reflexes throughout   .  Labs:  Lab Results  Component Value Date   WBC 3.2 (L) 09/15/2022   HGB 13.8 09/15/2022   HCT 41.3 09/15/2022   MCV 89 09/15/2022   PLT 192 09/15/2022    Lab Results  Component Value Date   CREATININE 0.72 09/15/2022   BUN 15 09/15/2022   NA  141 09/15/2022   K 5.0 09/15/2022   CL 106 09/15/2022   CO2 25 09/15/2022    Lab Results  Component Value Date   ALT 17 09/15/2022   AST 23 09/15/2022   ALKPHOS 47 09/15/2022   BILITOT 0.4 09/15/2022    Lab Results  Component Value Date   TSH 1.880 09/20/2022     Assessment:   1. Encounter for well woman exam with routine gynecological exam   2. Overweight (BMI 25.0-29.9)   3. Vitamin D deficiency   4. Screening for diabetes mellitus (DM)   5. Screening cholesterol level      Plan:  Blood tests: Pending. Breast self exam technique reviewed and patient encouraged to perform self-exam monthly. Contraception: IUD. Discussed healthy lifestyle modifications. Mammogram  Not age appropriate Pap smear  UTD . Flu vaccine:  Follow up in 1 year for annual exam   Hildred Laser, MD [facility]

## 2023-09-18 ENCOUNTER — Encounter: Payer: Self-pay | Admitting: Obstetrics and Gynecology

## 2023-09-18 ENCOUNTER — Ambulatory Visit (INDEPENDENT_AMBULATORY_CARE_PROVIDER_SITE_OTHER): Payer: BC Managed Care – PPO | Admitting: Obstetrics and Gynecology

## 2023-09-18 VITALS — BP 103/73 | HR 60 | Resp 16 | Ht 62.0 in | Wt 152.6 lb

## 2023-09-18 DIAGNOSIS — E538 Deficiency of other specified B group vitamins: Secondary | ICD-10-CM

## 2023-09-18 DIAGNOSIS — E663 Overweight: Secondary | ICD-10-CM

## 2023-09-18 DIAGNOSIS — Z13220 Encounter for screening for lipoid disorders: Secondary | ICD-10-CM

## 2023-09-18 DIAGNOSIS — E559 Vitamin D deficiency, unspecified: Secondary | ICD-10-CM

## 2023-09-18 DIAGNOSIS — Z975 Presence of (intrauterine) contraceptive device: Secondary | ICD-10-CM

## 2023-09-18 DIAGNOSIS — Z01419 Encounter for gynecological examination (general) (routine) without abnormal findings: Secondary | ICD-10-CM

## 2023-09-18 DIAGNOSIS — Z131 Encounter for screening for diabetes mellitus: Secondary | ICD-10-CM

## 2023-09-19 LAB — COMPREHENSIVE METABOLIC PANEL
ALT: 12 [IU]/L (ref 0–32)
AST: 13 [IU]/L (ref 0–40)
Albumin: 4.7 g/dL (ref 3.9–4.9)
Alkaline Phosphatase: 47 [IU]/L (ref 44–121)
BUN/Creatinine Ratio: 16 (ref 9–23)
BUN: 11 mg/dL (ref 6–20)
Bilirubin Total: 0.7 mg/dL (ref 0.0–1.2)
CO2: 25 mmol/L (ref 20–29)
Calcium: 9.3 mg/dL (ref 8.7–10.2)
Chloride: 104 mmol/L (ref 96–106)
Creatinine, Ser: 0.7 mg/dL (ref 0.57–1.00)
Globulin, Total: 2.3 g/dL (ref 1.5–4.5)
Glucose: 95 mg/dL (ref 70–99)
Potassium: 4.5 mmol/L (ref 3.5–5.2)
Sodium: 140 mmol/L (ref 134–144)
Total Protein: 7 g/dL (ref 6.0–8.5)
eGFR: 117 mL/min/{1.73_m2} (ref >59)

## 2023-09-19 LAB — CBC
Hematocrit: 41.1 % (ref 34.0–46.6)
Hemoglobin: 13.3 g/dL (ref 11.1–15.9)
MCH: 29.6 pg (ref 26.6–33.0)
MCHC: 32.4 g/dL (ref 31.5–35.7)
MCV: 91 fL (ref 79–97)
Platelets: 207 10*3/uL (ref 150–450)
RBC: 4.5 x10E6/uL (ref 3.77–5.28)
RDW: 11.3 % — ABNORMAL LOW (ref 11.7–15.4)
WBC: 4 10*3/uL (ref 3.4–10.8)

## 2023-09-19 LAB — LIPID PANEL
Chol/HDL Ratio: 3.2 {ratio} (ref 0.0–4.4)
Cholesterol, Total: 183 mg/dL (ref 100–199)
HDL: 57 mg/dL (ref >39)
LDL Chol Calc (NIH): 114 mg/dL — ABNORMAL HIGH (ref 0–99)
Triglycerides: 66 mg/dL (ref 0–149)
VLDL Cholesterol Cal: 12 mg/dL (ref 5–40)

## 2023-09-19 LAB — VITAMIN D 25 HYDROXY (VIT D DEFICIENCY, FRACTURES): Vit D, 25-Hydroxy: 32.9 ng/mL (ref 30.0–100.0)

## 2023-09-19 LAB — VITAMIN B12: Vitamin B-12: 621 pg/mL (ref 232–1245)

## 2023-09-20 ENCOUNTER — Other Ambulatory Visit: Payer: BC Managed Care – PPO

## 2023-09-20 ENCOUNTER — Telehealth: Payer: Self-pay

## 2023-09-20 DIAGNOSIS — Z01419 Encounter for gynecological examination (general) (routine) without abnormal findings: Secondary | ICD-10-CM

## 2023-09-20 NOTE — Telephone Encounter (Signed)
Per Mia, not enough serum. Pt will come in this PM to get blood work done.

## 2023-09-20 NOTE — Telephone Encounter (Addendum)
Pt called triage to ask about 2 test results that she doesn't see with other tests, Thyroid panel with TSH and Hemoglobin A1c. Advised those tests were not ordered and that I would check with lab tech if we can call and add them. Mia checking with lab now to make sure both can be added.

## 2023-09-20 NOTE — Telephone Encounter (Signed)
This encounter was created in error - please disregard.

## 2023-09-20 NOTE — Telephone Encounter (Signed)
Triage Voicemail: Patient has a few questions about her lab work.

## 2023-09-21 LAB — HEMOGLOBIN A1C
Est. average glucose Bld gHb Est-mCnc: 114 mg/dL
Hgb A1c MFr Bld: 5.6 % (ref 4.8–5.6)

## 2023-09-21 LAB — THYROID PANEL WITH TSH
Free Thyroxine Index: 1.6 (ref 1.2–4.9)
T3 Uptake Ratio: 20 % — ABNORMAL LOW (ref 24–39)
T4, Total: 8.2 ug/dL (ref 4.5–12.0)
TSH: 2.05 u[IU]/mL (ref 0.450–4.500)

## 2023-11-08 ENCOUNTER — Other Ambulatory Visit: Payer: Self-pay | Admitting: Neurology

## 2023-11-08 DIAGNOSIS — R9082 White matter disease, unspecified: Secondary | ICD-10-CM

## 2023-11-26 ENCOUNTER — Encounter: Payer: Self-pay | Admitting: Neurology

## 2023-12-04 ENCOUNTER — Inpatient Hospital Stay
Admission: RE | Admit: 2023-12-04 | Discharge: 2023-12-04 | Disposition: A | Discharge status: Home / Self Care | Payer: BC Managed Care – PPO | Source: Ambulatory Visit | Attending: Neurology | Admitting: Neurology

## 2023-12-04 DIAGNOSIS — R9082 White matter disease, unspecified: Secondary | ICD-10-CM

## 2023-12-04 MED ORDER — GADOPICLENOL 0.5 MMOL/ML IV SOLN
7.0000 mL | Freq: Once | INTRAVENOUS | Status: AC | PRN
Start: 2023-12-04 — End: 2023-12-04
  Administered 2023-12-04: 7 mL via INTRAVENOUS

## 2024-10-01 ENCOUNTER — Encounter: Payer: Self-pay | Admitting: Obstetrics

## 2024-10-01 NOTE — Progress Notes (Signed)
 GYNECOLOGY ANNUAL PHYSICAL EXAM NOTE  Subjective:    Hannah Larson is a 33 y.o. G45P2002 female who presents for an annual exam.  The patient is sexually active. The patient participates in regular exercise: yes. Has the patient ever been transfused or tattooed?: no. The patient reports that there is not domestic violence in her life. The patient has completed the Gardasil vaccine: no  The patient has the following complaints today: Requesting labwork: Vit D, B12 for prior deficiency, is taking daily supplement Would like to start Gardasil  Menstrual History: Menarche age: 34 Patient's last menstrual period was 09/20/2024. Period Cycle (Days): 28 Period Duration (Days): 3 Period Pattern: Regular Menstrual Flow: Light Menstrual Control: Maxi pad Menstrual Control Change Freq (Hours): 3 Dysmenorrhea: None  Gynecologic History:  Contraception: Mirena  - 33 years old; has light monthly periods History of STI's: none Last Pap: 09/13/2021. Results were: normal.   History of abnormal pap(s): none Last mammogram: NA  OB History  Gravida Para Term Preterm AB Living  2 2 2  0 0 2  SAB IAB Ectopic Multiple Live Births  0 0 0 0 2    # Outcome Date GA Lbr Len/2nd Weight Sex Type Anes PTL Lv  2 Term 07/02/17 [redacted]w[redacted]d 04:31 / 00:34 9 lb 14.4 oz (4.49 kg) M Vag-Spont Local  LIV     Name: [place],BOY Rubina     Apgar1: 9  Apgar5: 9  1 Term 06/15/15 [redacted]w[redacted]d 01:50 / 04:42 8 lb 5.9 oz (3.796 kg) M Vag-Spont Local N LIV     Apgar1: 9  Apgar5: 9    Past Medical History:  Diagnosis Date   Migraines 09/15/2022   Vertigo 09/15/2022    Past Surgical History:  Procedure Laterality Date   NO PAST SURGERIES      Family History  Problem Relation Age of Onset   Diabetes Sister    Healthy Mother    Healthy Father    Breast cancer Neg Hx    Migraines Neg Hx    Colon cancer Neg Hx    Heart disease Neg Hx     Social History   Socioeconomic History   Marital status: Married    Spouse name:  Not on file   Number of children: Not on file   Years of education: Not on file   Highest education level: Not on file  Occupational History   Not on file  Tobacco Use   Smoking status: Never   Smokeless tobacco: Never  Vaping Use   Vaping status: Never Used  Substance and Sexual Activity   Alcohol use: No   Drug use: No   Sexual activity: Yes    Birth control/protection: I.U.D.  Other Topics Concern   Not on file  Social History Narrative   Not on file   Social Drivers of Health   Financial Resource Strain: Not on file  Food Insecurity: Not on file  Transportation Needs: Not on file  Physical Activity: Not on file  Stress: Not on file  Social Connections: Not on file  Intimate Partner Violence: Not on file    Current Outpatient Medications on File Prior to Visit  Medication Sig Dispense Refill   Bacillus Coagulans-Inulin (PROBIOTIC) 1-250 BILLION-MG CAPS Take 1 capsule by mouth daily at 6 (six) AM.     Cholecalciferol (VITAMIN D -1000 MAX ST) 25 MCG (1000 UT) tablet Take 1,000 Units by mouth daily.     cyanocobalamin 1000 MCG tablet Take 1,000 mcg by mouth daily.  levonorgestrel  (MIRENA ) 20 MCG/24HR IUD 1 each by Intrauterine route once.     magnesium oxide (MAG-OX) 400 MG tablet Take 400 mg by mouth daily.     meclizine (ANTIVERT) 12.5 MG tablet Take 12.5 mg by mouth 3 (three) times daily as needed for dizziness.     SUMAtriptan (IMITREX) 100 MG tablet May take a second dose after 2 hours if needed.     No current facility-administered medications on file prior to visit.    No Known Allergies   Review of Systems Constitutional: negative for chills, fatigue, fevers and sweats Eyes: negative for irritation, redness and visual disturbance Ears, nose, mouth, throat, and face: negative for hearing loss, nasal congestion, snoring and tinnitus Respiratory: negative for asthma, cough, sputum Cardiovascular: negative for chest pain, dyspnea, exertional chest  pressure/discomfort, irregular heart beat, palpitations and syncope Gastrointestinal: negative for abdominal pain, change in bowel habits, nausea and vomiting Genitourinary: negative for abnormal menstrual periods, genital lesions, sexual problems and vaginal discharge, dysuria and urinary incontinence Integument/breast: negative for breast lump, breast tenderness and nipple discharge Hematologic/lymphatic: negative for bleeding and easy bruising Musculoskeletal:negative for back pain and muscle weakness Neurological: negative for dizziness, headaches, vertigo and weakness Endocrine: negative for diabetic symptoms including polydipsia, polyuria and skin dryness Allergic/Immunologic: negative for hay fever and urticaria      Objective:  Blood pressure 108/71, pulse 73, height 5' 2 (1.575 m), weight 154 lb 8 oz (70.1 kg), last menstrual period 09/20/2024. Body mass index is 28.26 kg/m.    General Appearance:    Alert, cooperative, no distress, appears stated age  Head:    Normocephalic, without obvious abnormality, atraumatic  Eyes:    Conjunctiva/corneas clear, EOMs intact bilaterally  Ears:    Normal external ear canals bilaterally  Nose:   Nares normal  Throat:   Lips, mucosa, and tongue normal; teeth and gums normal  Neck:   Supple, symmetrical, trachea midline, no adenopathy; thyroid : no enlargement/tenderness/nodules; no carotid bruit or JVD  Back:     ROM normal, no CVA tenderness  Lungs:     Clear to auscultation bilaterally, respirations unlabored  Chest Wall:    No tenderness or deformity   Heart:    Regular rate and rhythm, S1 and S2 normal, no appreciated murmur, rub or gallop  Breast Exam:    No tenderness, masses, or nipple abnormality; no skin retraction, dimpling or nipple discharge.  Abdomen:     Soft, non-tender, bowel sounds active all four quadrants, no masses, no organomegaly.    Genitalia:    Pelvic: external genitalia normal, vagina without lesions, discharge, or  tenderness, rectovaginal septum  normal. Cervix normal in appearance, no cervical motion tenderness, no adnexal masses or tenderness.  Uterus normal size, shape, mobile, regular contours, nontender.  Rectal:    Normal external sphincter.  No hemorrhoids appreciated. Internal exam not done.   Extremities:   Extremities normal, atraumatic, no cyanosis or edema  Pulses:   2+ and symmetric all extremities  Skin:   Skin color, texture, turgor normal, no rashes or lesions  Lymph nodes:   Cervical, supraclavicular, and axillary nodes normal  Neurologic:   CNII-XII grossly intact, normal strength, sensation and reflexes throughout   .  Labs:  Lab Results  Component Value Date   WBC 4.0 09/18/2023   HGB 13.3 09/18/2023   HCT 41.1 09/18/2023   MCV 91 09/18/2023   PLT 207 09/18/2023    Lab Results  Component Value Date   CREATININE 0.70 09/18/2023  BUN 11 09/18/2023   NA 140 09/18/2023   K 4.5 09/18/2023   CL 104 09/18/2023   CO2 25 09/18/2023    Lab Results  Component Value Date   ALT 12 09/18/2023   AST 13 09/18/2023   ALKPHOS 47 09/18/2023   BILITOT 0.7 09/18/2023    Lab Results  Component Value Date   TSH 2.050 09/20/2023     Assessment:   1. Well woman exam with routine gynecological exam   2. Cervical cancer screening   3. Screening for diabetes mellitus   4. Screening cholesterol level   5. Need for hepatitis C screening test   6. Need for HPV vaccine   7. Encounter for vitamin deficiency screening   8. B12 deficiency      Plan:   Hannah Larson is a 33 y.o. G66P2002 female here today for her annual exam, doing well.  Pap: due 09/2026 Mammogram: N/A Labs: A1C, HepC, Lipid panel, Vit D, B12 PHQ-2 = 0 Contraception: Mirena , due for reinsertion 2027 Vaccines: Gardasil #1 given today; declines Flu Healthy lifestyle modifications discussed: multivitamin, diet, exercise, sunscreen. Emphasized importance of regular physical activity.  Folic Acid recommendation  reviewed.  All questions answered to patient's satisfaction.   Follow up 1 yr for annual, sooner prn.    Estil Mangle, DO [facility]

## 2024-10-06 NOTE — Patient Instructions (Signed)

## 2024-10-07 ENCOUNTER — Encounter: Payer: Self-pay | Admitting: Obstetrics

## 2024-10-07 ENCOUNTER — Ambulatory Visit: Admitting: Obstetrics

## 2024-10-07 VITALS — BP 108/71 | HR 73 | Ht 62.0 in | Wt 154.5 lb

## 2024-10-07 DIAGNOSIS — Z131 Encounter for screening for diabetes mellitus: Secondary | ICD-10-CM

## 2024-10-07 DIAGNOSIS — Z01419 Encounter for gynecological examination (general) (routine) without abnormal findings: Secondary | ICD-10-CM | POA: Diagnosis not present

## 2024-10-07 DIAGNOSIS — E538 Deficiency of other specified B group vitamins: Secondary | ICD-10-CM | POA: Diagnosis not present

## 2024-10-07 DIAGNOSIS — Z124 Encounter for screening for malignant neoplasm of cervix: Secondary | ICD-10-CM

## 2024-10-07 DIAGNOSIS — Z1159 Encounter for screening for other viral diseases: Secondary | ICD-10-CM

## 2024-10-07 DIAGNOSIS — Z23 Encounter for immunization: Secondary | ICD-10-CM | POA: Diagnosis not present

## 2024-10-07 DIAGNOSIS — Z13220 Encounter for screening for lipoid disorders: Secondary | ICD-10-CM

## 2024-10-07 DIAGNOSIS — Z1321 Encounter for screening for nutritional disorder: Secondary | ICD-10-CM

## 2024-10-08 ENCOUNTER — Ambulatory Visit: Payer: Self-pay | Admitting: Obstetrics

## 2024-10-08 LAB — LIPID PANEL
Chol/HDL Ratio: 3.8 ratio (ref 0.0–4.4)
Cholesterol, Total: 219 mg/dL — ABNORMAL HIGH (ref 100–199)
HDL: 57 mg/dL (ref >39)
LDL Chol Calc (NIH): 149 mg/dL — ABNORMAL HIGH (ref 0–99)
Triglycerides: 73 mg/dL (ref 0–149)
VLDL Cholesterol Cal: 13 mg/dL (ref 5–40)

## 2024-10-08 LAB — HEMOGLOBIN A1C
Est. average glucose Bld gHb Est-mCnc: 108 mg/dL
Hgb A1c MFr Bld: 5.4 % (ref 4.8–5.6)

## 2024-10-08 LAB — HEPATITIS C ANTIBODY: Hep C Virus Ab: NONREACTIVE

## 2024-10-08 LAB — VITAMIN B12: Vitamin B-12: 728 pg/mL (ref 232–1245)

## 2024-10-08 LAB — VITAMIN D 25 HYDROXY (VIT D DEFICIENCY, FRACTURES): Vit D, 25-Hydroxy: 29.8 ng/mL — ABNORMAL LOW (ref 30.0–100.0)

## 2024-11-14 ENCOUNTER — Other Ambulatory Visit: Payer: Self-pay

## 2024-11-14 ENCOUNTER — Emergency Department
Admission: EM | Admit: 2024-11-14 | Discharge: 2024-11-14 | Disposition: A | Discharge status: Home / Self Care | Attending: Emergency Medicine | Admitting: Emergency Medicine

## 2024-11-14 ENCOUNTER — Encounter: Payer: Self-pay | Admitting: Emergency Medicine

## 2024-11-14 DIAGNOSIS — J101 Influenza due to other identified influenza virus with other respiratory manifestations: Secondary | ICD-10-CM | POA: Insufficient documentation

## 2024-11-14 DIAGNOSIS — J0100 Acute maxillary sinusitis, unspecified: Secondary | ICD-10-CM | POA: Insufficient documentation

## 2024-11-14 LAB — RESP PANEL BY RT-PCR (RSV, FLU A&B, COVID)  RVPGX2
Influenza A by PCR: NEGATIVE
Influenza B by PCR: POSITIVE — AB
Resp Syncytial Virus by PCR: NEGATIVE
SARS Coronavirus 2 by RT PCR: NEGATIVE

## 2024-11-14 MED ORDER — AMOXICILLIN 500 MG PO CAPS: 500.0000 mg | ORAL_CAPSULE | Freq: Three times a day (TID) | ORAL | 0 refills | Status: AC

## 2024-11-14 MED ORDER — METOCLOPRAMIDE HCL 5 MG PO TABS: 5.0000 mg | ORAL_TABLET | Freq: Three times a day (TID) | ORAL | 0 refills | Status: AC

## 2024-11-14 MED ORDER — METOCLOPRAMIDE HCL 10 MG PO TABS
5.0000 mg | ORAL_TABLET | Freq: Once | ORAL | Status: AC
  Administered 2024-11-14: 5 mg via ORAL
  Filled 2024-11-14: qty 1

## 2024-11-14 NOTE — ED Provider Notes (Signed)
 [facility] Provider Note    Event Date/Time   First MD Initiated Contact with Patient 11/14/24 1959     (approximate)   History   Cough, Nasal Congestion, and Headache    HPI  Hannah Larson is a 33 y.o. female    with a past medical history of steroid migraine, B12 deficiency,  who presents to the ED complaining of cough and nasal congestion. According to the patient, symptoms started 4 days ago with fever, sore throat, headache, dizziness, postnasal drip, asthenia, adynamia.  Today diarrhea diarrhea x 1.  Patient endorses her son is having the same symptoms.      Patient Active Problem List   Diagnosis Date Noted   Migraines 09/15/2022   Vertigo 09/15/2022   IUD (intrauterine device) in place 10/02/2018   Palpitations 05/03/2018   Overweight (BMI 25.0-29.9) 12/17/2016     Physical Exam   Triage Vital Signs: ED Triage Vitals [11/14/24 1941]  Encounter Vitals Group     BP 111/78     Girls Systolic BP Percentile      Girls Diastolic BP Percentile      Boys Systolic BP Percentile      Boys Diastolic BP Percentile      Pulse Rate 77     Resp 18     Temp 98.6 F (37 C)     Temp Source Oral     SpO2 98 %     Weight 155 lb (70.3 kg)     Height 5' 2 (1.575 m)     Head Circumference      Peak Flow      Pain Score 8     Pain Loc      Pain Education      Exclude from Growth Chart     Most recent vital signs: Vitals:   11/14/24 1941  BP: 111/78  Pulse: 77  Resp: 18  Temp: 98.6 F (37 C)  SpO2: 98%     Physical Exam Vitals and nursing note reviewed.   General:          Awake, no distress.  Face: Tenderness to palpation in frontal sinuses. Throat: Dry mucosa, peritonsillar erythema.  No exudates Right otoscopy: Presence of gray eardrum, tympanic membrane appears to dull.  No otorrhea. Left ear: Otoscopy within normal limits CV:                  Good peripheral perfusion. Regular rate and rhythm. Resp:               Normal effort.  no tachypnea.Equal breath sounds bilaterally.  Abd:                 No distention.  Soft nontender           ED Results / Procedures / Treatments   Labs (all labs ordered are listed, but only abnormal results are displayed) Labs Reviewed  RESP PANEL BY RT-PCR (RSV, FLU A&B, COVID)  [id number] - Abnormal; Notable for the following components:      Result Value   Influenza B by PCR POSITIVE (*)    All other components within normal limits      PROCEDURES:  Critical Care performed:   Procedures   MEDICATIONS ORDERED IN ED: Medications  metoCLOPramide (REGLAN) tablet 5 mg (has no administration in time range)   Clinical Course as of 11/14/24 2125  Fri Nov 14, 2024  2100 Resp panel by RT-PCR (RSV, Flu A&B,  Covid) Anterior Nasal Swab(!) Influenza positive [AE]    Clinical Course User Index [AE] Janit Kast, PA-C    IMPRESSION / MDM / ASSESSMENT AND PLAN / ED COURSE  I reviewed the triage vital signs and the nursing notes.  Differential diagnosis includes, but is not limited to, influenza, COVID, RSV, sinusitis, unlikely pneumonia  Patient's presentation is most consistent with acute complicated illness / injury requiring diagnostic workup.   Hannah Larson is a 33 y.o., female who presents today with history of 4 days of sore throat, fever, nauseas, diarrhea, dizziness, headache.  See HPI for further information.  On a physical exam, vital signs were normal during triage.  There is tenderness to palpation in frontal sinuses, right otoscopy showed dull tympanic membrane, throat with dry mucosa, peritonsillar erythema.  Cardiopulmonary is clear.  Rest of physical exam is normal. Respiratory panel is positive for influenza A Plan Metoclopramide Discharge. Patient's diagnosis is consistent with influenza A, sinus infection. Labs are  reassuring. I did review the patient's allergies and medications.The patient is in stable and satisfactory condition for discharge home  Patient  will be discharged home with prescriptions for metoclopramide,  amoxicillin. Patient is to follow up with ECP as needed or otherwise directed. Patient is given ED precautions to return to the ED for any worsening or new symptoms. Discussed plan of care with patient, answered all of patient's questions, patient agreeable to plan of care. Advised patient to take medications according to the instructions on the label. Discussed possible side effects of new medications. Patient verbalized understanding.  FINAL CLINICAL IMPRESSION(S) / ED DIAGNOSES   Final diagnoses:  Influenza A  Acute non-recurrent maxillary sinusitis     Rx / DC Orders   ED Discharge Orders          Ordered    metoCLOPramide (REGLAN) 5 MG tablet  3 times daily        11/14/24 2124    amoxicillin (AMOXIL) 500 MG capsule  3 times daily        11/14/24 2124             Note:  This document was prepared using Dragon voice recognition software and may include unintentional dictation errors.   Janit Kast, PA-C 11/14/24 2125    Willo Dunnings, MD 11/14/24 2328

## 2024-11-14 NOTE — ED Notes (Signed)
 Pt verbalizes understanding of discharge instructions. Opportunity for questioning and answers were provided. Pt discharged from ED at this time.

## 2024-11-14 NOTE — ED Triage Notes (Signed)
 Pt arrives POV, ambulatory to triage, gait steady, no acute distress noted c/o flu-like s/s since Monday. Pt reports HA, cough, congestion, intermittent tinnitus in right ear. Pt's son is also has same symptoms; denies sick contacts.

## 2024-11-14 NOTE — Discharge Instructions (Addendum)
 You have been diagnosed with influenza A, acute sinus infection.  Please take Reglan 1 tablet by mouth 20 minutes before main meals.  Please take amoxicillin 1 capsule by mouth every 8 hours for 7 days for sinus infection.  Please drink plenty fluids.  Please come back to ED or go to your PCP if you have any symptoms symptoms worsen.

## 2024-12-08 ENCOUNTER — Ambulatory Visit

## 2024-12-08 VITALS — BP 106/72 | HR 82 | Wt 156.8 lb

## 2024-12-08 DIAGNOSIS — Z23 Encounter for immunization: Secondary | ICD-10-CM

## 2024-12-08 NOTE — Progress Notes (Signed)
" ° ° °  NURSE VISIT NOTE  Subjective:    Patient ID: Camelia Ruth, female    DOB: December 14, 1990, 34 y.o.   MRN: [id number]  HPI  Patient is a 34 y.o. G59P2002 female Married Asian female who presents for her second Gardasil injection. Order to administer given by Estil Mangle, MD on 10/07/2024.   Objective:    BP 106/72   Pulse 82   Wt 156 lb 12.8 oz (71.1 kg)   LMP 11/20/2024 (Exact Date)   BMI 28.68 kg/m   34 y.o. LMP:  11/20/24  Contraception:  *Mirena  Given by: Mathis Getting, CMA Site:  left deltoid  Lab Review  No results found for any visits on 12/08/24.    Assessment:   1. Need for HPV vaccine      Plan:   Patient will return in 4 months for third injection.    Mathis LITTIE Getting, CMA  "

## 2024-12-08 NOTE — Patient Instructions (Signed)
Human Papillomavirus (HPV) Vaccine Injection What is this medication? HUMAN PAPILLOMAVIRUS VACCINE (HYOO muhn pap uh LOH muh vahy ruhs vak SEEN) reduces the risk of human papillomavirus (HPV). It does not treat HPV. It is still possible to get HPV after receiving this vaccine, but the symptoms may be less severe or not last as long. It works by helping your immune system learn how to fight off a future infection. This medicine may be used for other purposes; ask your health care provider or pharmacist if you have questions. COMMON BRAND NAME(S): Gardasil 9 What should I tell my care team before I take this medication? They need to know if you have any of these conditions: Fever Hemophilia HIV or AIDS Immune system problems Infection Low platelets An unusual reaction to human papillomavirus vaccine, yeast, other vaccines, other medications, foods, dyes, or preservatives Pregnant or trying to get pregnant Breastfeeding How should I use this medication? This vaccine is injected into a muscle. It is given by your care team. This vaccine requires 2 or 3 doses to get the full benefit. Set a reminder for when your next dose is due. A copy of the Vaccine Information Statement will be given before each vaccination. Be sure to read this information carefully each time. This sheet may change often. Talk to your care team about the use of this medication in children. While it may be prescribed for children as young as 9 years for selected conditions, precautions do apply. Overdosage: If you think you have taken too much of this medicine contact a poison control center or emergency room at once. NOTE: This medicine is only for you. Do not share this medicine with others. What if I miss a dose? Keep appointments for follow-up doses as directed. It is important not to miss your dose. Call your care team if you are unable to keep an appointment. What may interact with this medication? Certain medications  for arthritis Medications for organ transplant Medications to treat cancer Steroid medications, such as prednisone or cortisone This list may not describe all possible interactions. Give your health care provider a list of all the medicines, herbs, non-prescription drugs, or dietary supplements you use. Also tell them if you smoke, drink alcohol, or use illegal drugs. Some items may interact with your medicine. What should I watch for while using this medication? Visit your care team regularly. Report any side effects to your care team right away. This vaccine, like all vaccines, may not fully protect everyone. What side effects may I notice from receiving this medication? Side effects that you should report to your care team as soon as possible: Allergic reactions--skin rash, itching, hives, swelling of the face, lips, tongue, or throat Feeling faint or lightheaded Side effects that usually do not require medical attention (report these to your care team if they continue or are bothersome): Diarrhea Dizziness Fatigue Fever Headache Nausea Pain, redness, irritation, or bruising at the injection site This list may not describe all possible side effects. Call your doctor for medical advice about side effects. You may report side effects to FDA at 1-800-FDA-1088. Where should I keep my medication? This vaccine is only given by your care team. It will not be stored at home. NOTE: This sheet is a summary. It may not cover all possible information. If you have questions about this medicine, talk to your doctor, pharmacist, or health care provider.  2024 Elsevier/Gold Standard (2022-05-03 00:00:00)

## 2025-04-06 ENCOUNTER — Ambulatory Visit
